# Patient Record
Sex: Male | Born: 1955 | Smoking: Former smoker
Health system: Southern US, Community
[De-identification: ages and names within clinical notes are randomized; demographics above are authoritative.]

## PROBLEM LIST (undated history)

## (undated) DIAGNOSIS — Z8616 Personal history of COVID-19: Secondary | ICD-10-CM

## (undated) DIAGNOSIS — I639 Cerebral infarction, unspecified: Secondary | ICD-10-CM

## (undated) DIAGNOSIS — K5090 Crohn's disease, unspecified, without complications: Secondary | ICD-10-CM

## (undated) DIAGNOSIS — E119 Type 2 diabetes mellitus without complications: Secondary | ICD-10-CM

## (undated) HISTORY — PX: ANTERIOR CRUCIATE LIGAMENT REPAIR: SHX115

## (undated) HISTORY — PX: OTHER SURGICAL HISTORY: SHX169

## (undated) HISTORY — PX: ILEOSTOMY: SHX1783

---

## 2011-10-24 DIAGNOSIS — K5190 Ulcerative colitis, unspecified, without complications: Secondary | ICD-10-CM | POA: Insufficient documentation

## 2012-11-26 DIAGNOSIS — Z9889 Other specified postprocedural states: Secondary | ICD-10-CM | POA: Insufficient documentation

## 2013-01-03 DIAGNOSIS — R188 Other ascites: Secondary | ICD-10-CM | POA: Insufficient documentation

## 2013-03-23 DIAGNOSIS — Z87448 Personal history of other diseases of urinary system: Secondary | ICD-10-CM | POA: Insufficient documentation

## 2015-03-19 DIAGNOSIS — E119 Type 2 diabetes mellitus without complications: Secondary | ICD-10-CM | POA: Insufficient documentation

## 2016-01-31 DIAGNOSIS — I639 Cerebral infarction, unspecified: Secondary | ICD-10-CM | POA: Insufficient documentation

## 2016-01-31 DIAGNOSIS — S46211A Strain of muscle, fascia and tendon of other parts of biceps, right arm, initial encounter: Secondary | ICD-10-CM | POA: Insufficient documentation

## 2016-02-01 DIAGNOSIS — R109 Unspecified abdominal pain: Secondary | ICD-10-CM | POA: Insufficient documentation

## 2017-04-01 DIAGNOSIS — Z8719 Personal history of other diseases of the digestive system: Secondary | ICD-10-CM | POA: Insufficient documentation

## 2017-04-08 DIAGNOSIS — E1142 Type 2 diabetes mellitus with diabetic polyneuropathy: Secondary | ICD-10-CM | POA: Insufficient documentation

## 2017-04-08 DIAGNOSIS — M205X1 Other deformities of toe(s) (acquired), right foot: Secondary | ICD-10-CM | POA: Insufficient documentation

## 2018-04-16 DIAGNOSIS — G8929 Other chronic pain: Secondary | ICD-10-CM | POA: Insufficient documentation

## 2018-04-16 DIAGNOSIS — M75121 Complete rotator cuff tear or rupture of right shoulder, not specified as traumatic: Secondary | ICD-10-CM | POA: Insufficient documentation

## 2019-03-19 DIAGNOSIS — U071 COVID-19: Secondary | ICD-10-CM

## 2019-03-19 HISTORY — DX: COVID-19: U07.1

## 2019-09-15 ENCOUNTER — Encounter (HOSPITAL_BASED_OUTPATIENT_CLINIC_OR_DEPARTMENT_OTHER): Payer: Self-pay | Admitting: Orthopaedic Surgery

## 2019-09-15 ENCOUNTER — Other Ambulatory Visit: Payer: Self-pay

## 2019-09-15 NOTE — Progress Notes (Signed)
Left message with Jack Clark at Dr. Austin Miles office that we need orders for patient to hold Plavix and Asprin for surgery.

## 2019-09-20 NOTE — H&P (Signed)
PREOPERATIVE H&P  Chief Complaint: RIGHT SHOULDER PRIMARY OSTEOARTHRITIS, IMPINGEMENT SYNDROME, ROTATOR CUFF TEAR  HPI: Jack Clark is a 64 y.o. male who is scheduled for SHOULDER ARTHROSCOPY WITH EXTENSIVE DEBRIDEMENT, DISTAL CLAVICLE EXCISION, SUBACROMIAL DECOMPRESSION PARTIAL ACROMIOPLASTY SHOULDER ARTHROSCOPY WITH ROTATOR CUFF REPAIR.   Patient has a past medical history significant for stroke in 2018, chron's disease, diabetes.   The patient was seen in consultation at the request of Dr. Sherlean Clark for right shoulder pain. He is  a 64 year old who is the husband of Jack Clark. He has had right shoulder pain for years. This has been worse for the last month. He has had surgery for Crohns and had an ileostomy and a reversal.  He had a stroke which affected his right side. He has an A1C of 7.4.   He is right hand dominant at baseline. He was trying to pressure wash and he had significant pain. He tore his distal biceps on the same side and  Dr. Amanda Clark fixed this previously.   His symptoms are rated as moderate to severe, and have been worsening.  This is significantly impairing activities of daily living.    Please see clinic note for further details on this patient's care.    He has elected for surgical management.   Past Medical History:  Diagnosis Date   Crohn's disease (HCC)    Diabetes mellitus without complication (HCC)    Stroke (HCC)    2018 no deficits    Past Surgical History:  Procedure Laterality Date   ANTERIOR CRUCIATE LIGAMENT REPAIR     ILEOSTOMY     ileostomy reversal     Social History   Socioeconomic History   Marital status: Unknown    Spouse name: Not on file   Number of children: Not on file   Years of education: Not on file   Highest education level: Not on file  Occupational History   Not on file  Tobacco Use   Smoking status: Former Smoker   Smokeless tobacco: Never Used   Tobacco comment: quit 15 years ago  Substance and  Sexual Activity   Alcohol use: Not Currently   Drug use: Not Currently   Sexual activity: Not on file  Other Topics Concern   Not on file  Social History Narrative   Not on file   Social Determinants of Health   Financial Resource Strain:    Difficulty of Paying Living Expenses:   Food Insecurity:    Worried About Programme researcher, broadcasting/film/video in the Last Year:    Barista in the Last Year:   Transportation Needs:    Freight forwarder (Medical):    Lack of Transportation (Non-Medical):   Physical Activity:    Days of Exercise per Week:    Minutes of Exercise per Session:   Stress:    Feeling of Stress :   Social Connections:    Frequency of Communication with Friends and Family:    Frequency of Social Gatherings with Friends and Family:    Attends Religious Services:    Active Member of Clubs or Organizations:    Attends Banker Meetings:    Marital Status:    History reviewed. No pertinent family history. No Known Allergies Prior to Admission medications   Medication Sig Start Date End Date Taking? Authorizing Provider  aspirin 81 MG chewable tablet Chew by mouth daily.   Yes [provider]  atorvastatin (LIPITOR) 20 MG tablet Take  20 mg by mouth daily.   Yes [provider]  clopidogrel (PLAVIX) 75 MG tablet Take 75 mg by mouth daily.   Yes [provider]  empagliflozin (JARDIANCE) 25 MG TABS tablet Take 25 mg by mouth daily.   Yes [provider]  metFORMIN (GLUCOPHAGE) 500 MG tablet Take by mouth 2 (two) times daily with a meal.   Yes [provider]    ROS: All other systems have been reviewed and were otherwise negative with the exception of those mentioned in the HPI and as above.  Physical Exam: General: Alert, no acute distress Cardiovascular: No pedal edema Respiratory: No cyanosis, no use of accessory musculature GI: No organomegaly, abdomen is soft and non-tender Skin: No  lesions in the area of chief complaint Neurologic: Sensation intact distally Psychiatric: Patient is competent for consent with normal mood and affect Lymphatic: No axillary or cervical lymphadenopathy  MUSCULOSKELETAL:  Right shoulder: right shoulder reveals an active forward elevation to 120, passive to the same. External rotation to 30 and internal rotation to L1. Weak supraspinatus.   Imaging: MRI demonstrates a full thickness supraspinatus tear  X-rays today, three views, demonstrate no acute osseous abnormalities. AC arthrosis and Type II acromion.  Assessment: RIGHT SHOULDER PRIMARY OSTEOARTHRITIS, IMPINGEMENT SYNDROME, ROTATOR CUFF TEAR  Plan: Plan for Procedure(s): SHOULDER ARTHROSCOPY WITH EXTENSIVE DEBRIDEMENT, DISTAL CLAVICLE EXCISION, SUBACROMIAL DECOMPRESSION PARTIAL ACROMIOPLASTY SHOULDER ARTHROSCOPY WITH ROTATOR CUFF REPAIR  The risks benefits and alternatives were discussed with the patient including but not limited to the risks of nonoperative treatment, versus surgical intervention including infection, bleeding, nerve injury,  blood clots, cardiopulmonary complications, morbidity, mortality, among others, and they were willing to proceed.   The patient acknowledged the explanation, agreed to proceed with the plan and consent was signed.   He received clearance from his PCP, Dr. Maryjean Clark - okay to stop plavix 5 days prior to surgery.   Operative Plan: Right shoulder scope with SAD, DCE, RCR, BT Discharge Medications: Tylenol, Celebrex (short term), oxycodone, zofran DVT Prophylaxis: Resume plavix Physical Therapy: Outpatient PT Special Discharge needs: Sling   Jack Honey, PA-C  09/20/2019 6:25 PM

## 2019-09-21 ENCOUNTER — Other Ambulatory Visit (HOSPITAL_COMMUNITY)
Admission: RE | Admit: 2019-09-21 | Discharge: 2019-09-21 | Disposition: A | Discharge status: Home / Self Care | Payer: PRIVATE HEALTH INSURANCE | Source: Ambulatory Visit | Attending: Orthopaedic Surgery | Admitting: Orthopaedic Surgery

## 2019-09-21 ENCOUNTER — Encounter (HOSPITAL_BASED_OUTPATIENT_CLINIC_OR_DEPARTMENT_OTHER)
Admission: RE | Admit: 2019-09-21 | Discharge: 2019-09-21 | Disposition: A | Discharge status: Home / Self Care | Payer: PRIVATE HEALTH INSURANCE | Source: Ambulatory Visit | Attending: Orthopaedic Surgery | Admitting: Orthopaedic Surgery

## 2019-09-21 DIAGNOSIS — Z20822 Contact with and (suspected) exposure to covid-19: Secondary | ICD-10-CM | POA: Insufficient documentation

## 2019-09-21 DIAGNOSIS — Z01818 Encounter for other preprocedural examination: Secondary | ICD-10-CM | POA: Diagnosis not present

## 2019-09-21 LAB — BASIC METABOLIC PANEL
Anion gap: 10 (ref 5–15)
BUN: 9 mg/dL (ref 8–23)
CO2: 25 mmol/L (ref 22–32)
Calcium: 9.1 mg/dL (ref 8.9–10.3)
Chloride: 104 mmol/L (ref 98–111)
Creatinine, Ser: 0.92 mg/dL (ref 0.61–1.24)
GFR calc Af Amer: >60 mL/min (ref >60)
GFR calc non Af Amer: >60 mL/min (ref >60)
Glucose, Bld: 153 mg/dL — ABNORMAL HIGH (ref 70–99)
Potassium: 4.5 mmol/L (ref 3.5–5.1)
Sodium: 139 mmol/L (ref 135–145)

## 2019-09-21 LAB — SARS CORONAVIRUS 2 (TAT 6-24 HRS): SARS Coronavirus 2: NEGATIVE

## 2019-09-21 NOTE — Progress Notes (Signed)

## 2019-09-23 ENCOUNTER — Other Ambulatory Visit: Payer: Self-pay

## 2019-09-23 ENCOUNTER — Ambulatory Visit (HOSPITAL_BASED_OUTPATIENT_CLINIC_OR_DEPARTMENT_OTHER): Payer: PRIVATE HEALTH INSURANCE | Admitting: Anesthesiology

## 2019-09-23 ENCOUNTER — Ambulatory Visit (HOSPITAL_BASED_OUTPATIENT_CLINIC_OR_DEPARTMENT_OTHER)
Admission: RE | Admit: 2019-09-23 | Discharge: 2019-09-23 | Disposition: A | Discharge status: Home / Self Care | Payer: PRIVATE HEALTH INSURANCE | Attending: Orthopaedic Surgery | Admitting: Orthopaedic Surgery

## 2019-09-23 ENCOUNTER — Encounter (HOSPITAL_BASED_OUTPATIENT_CLINIC_OR_DEPARTMENT_OTHER): Payer: Self-pay | Admitting: Orthopaedic Surgery

## 2019-09-23 ENCOUNTER — Encounter (HOSPITAL_BASED_OUTPATIENT_CLINIC_OR_DEPARTMENT_OTHER)
Admission: RE | Disposition: A | Discharge status: Home / Self Care | Payer: Self-pay | Source: Home / Self Care | Attending: Orthopaedic Surgery

## 2019-09-23 DIAGNOSIS — S46111A Strain of muscle, fascia and tendon of long head of biceps, right arm, initial encounter: Secondary | ICD-10-CM | POA: Diagnosis not present

## 2019-09-23 DIAGNOSIS — M7541 Impingement syndrome of right shoulder: Secondary | ICD-10-CM | POA: Diagnosis not present

## 2019-09-23 DIAGNOSIS — M75121 Complete rotator cuff tear or rupture of right shoulder, not specified as traumatic: Secondary | ICD-10-CM | POA: Insufficient documentation

## 2019-09-23 DIAGNOSIS — X58XXXA Exposure to other specified factors, initial encounter: Secondary | ICD-10-CM | POA: Diagnosis not present

## 2019-09-23 DIAGNOSIS — Z7984 Long term (current) use of oral hypoglycemic drugs: Secondary | ICD-10-CM | POA: Insufficient documentation

## 2019-09-23 DIAGNOSIS — Z79899 Other long term (current) drug therapy: Secondary | ICD-10-CM | POA: Diagnosis not present

## 2019-09-23 DIAGNOSIS — K5090 Crohn's disease, unspecified, without complications: Secondary | ICD-10-CM | POA: Diagnosis not present

## 2019-09-23 DIAGNOSIS — Z8673 Personal history of transient ischemic attack (TIA), and cerebral infarction without residual deficits: Secondary | ICD-10-CM | POA: Diagnosis not present

## 2019-09-23 DIAGNOSIS — Z7902 Long term (current) use of antithrombotics/antiplatelets: Secondary | ICD-10-CM | POA: Insufficient documentation

## 2019-09-23 DIAGNOSIS — M19011 Primary osteoarthritis, right shoulder: Secondary | ICD-10-CM | POA: Insufficient documentation

## 2019-09-23 DIAGNOSIS — E119 Type 2 diabetes mellitus without complications: Secondary | ICD-10-CM | POA: Diagnosis not present

## 2019-09-23 DIAGNOSIS — Z7982 Long term (current) use of aspirin: Secondary | ICD-10-CM | POA: Insufficient documentation

## 2019-09-23 DIAGNOSIS — Z87891 Personal history of nicotine dependence: Secondary | ICD-10-CM | POA: Insufficient documentation

## 2019-09-23 HISTORY — DX: Cerebral infarction, unspecified: I63.9

## 2019-09-23 HISTORY — PX: SHOULDER ARTHROSCOPY WITH ROTATOR CUFF REPAIR: SHX5685

## 2019-09-23 HISTORY — DX: Type 2 diabetes mellitus without complications: E11.9

## 2019-09-23 HISTORY — DX: Crohn's disease, unspecified, without complications: K50.90

## 2019-09-23 HISTORY — PX: BICEPT TENODESIS: SHX5116

## 2019-09-23 LAB — GLUCOSE, CAPILLARY
Glucose-Capillary: 178 mg/dL — ABNORMAL HIGH (ref 70–99)
Glucose-Capillary: 149 mg/dL — ABNORMAL HIGH (ref 70–99)

## 2019-09-23 SURGERY — SHOULDER ARTHROSCOPY WITH SUBACROMIAL DECOMPRESSION AND DISTAL CLAVICLE EXCISION
Anesthesia: General | Site: Shoulder | Laterality: Right

## 2019-09-23 MED ORDER — ACETAMINOPHEN 160 MG/5ML PO SOLN: 325.0000 mg | ORAL | Status: DC | PRN

## 2019-09-23 MED ORDER — BUPIVACAINE LIPOSOME 1.3 % IJ SUSP
INTRAMUSCULAR | Status: DC | PRN
Start: 2019-09-23 — End: 2019-09-23
  Administered 2019-09-23 (×5): 2 mL via PERINEURAL

## 2019-09-23 MED ORDER — SODIUM CHLORIDE 0.9 % IR SOLN
Status: DC | PRN
  Administered 2019-09-23: 12000 mL

## 2019-09-23 MED ORDER — FENTANYL CITRATE (PF) 100 MCG/2ML IJ SOLN
INTRAMUSCULAR | Status: AC
  Filled 2019-09-23: qty 2

## 2019-09-23 MED ORDER — LACTATED RINGERS IV SOLN: INTRAVENOUS | Status: DC

## 2019-09-23 MED ORDER — MIDAZOLAM HCL 2 MG/2ML IJ SOLN
INTRAMUSCULAR | Status: AC
  Filled 2019-09-23: qty 2

## 2019-09-23 MED ORDER — EPINEPHRINE PF 1 MG/ML IJ SOLN
INTRAMUSCULAR | Status: AC
  Filled 2019-09-23: qty 1

## 2019-09-23 MED ORDER — DEXAMETHASONE SODIUM PHOSPHATE 4 MG/ML IJ SOLN
INTRAMUSCULAR | Status: DC | PRN
  Administered 2019-09-23: 5 mg via INTRAVENOUS

## 2019-09-23 MED ORDER — CEFAZOLIN SODIUM-DEXTROSE 2-4 GM/100ML-% IV SOLN
2.0000 g | INTRAVENOUS | Status: AC
  Administered 2019-09-23: 2 g via INTRAVENOUS

## 2019-09-23 MED ORDER — ONDANSETRON HCL 4 MG/2ML IJ SOLN
INTRAMUSCULAR | Status: DC | PRN
  Administered 2019-09-23: 4 mg via INTRAVENOUS

## 2019-09-23 MED ORDER — LIDOCAINE 2% (20 MG/ML) 5 ML SYRINGE
INTRAMUSCULAR | Status: AC
  Filled 2019-09-23: qty 5

## 2019-09-23 MED ORDER — PHENYLEPHRINE 40 MCG/ML (10ML) SYRINGE FOR IV PUSH (FOR BLOOD PRESSURE SUPPORT)
PREFILLED_SYRINGE | INTRAVENOUS | Status: AC
  Filled 2019-09-23: qty 10

## 2019-09-23 MED ORDER — DEXAMETHASONE SODIUM PHOSPHATE 10 MG/ML IJ SOLN
INTRAMUSCULAR | Status: AC
  Filled 2019-09-23: qty 1

## 2019-09-23 MED ORDER — SUCCINYLCHOLINE CHLORIDE 200 MG/10ML IV SOSY
PREFILLED_SYRINGE | INTRAVENOUS | Status: AC
  Filled 2019-09-23: qty 10

## 2019-09-23 MED ORDER — ONDANSETRON HCL 4 MG/2ML IJ SOLN: 4.0000 mg | Freq: Once | INTRAMUSCULAR | Status: DC | PRN

## 2019-09-23 MED ORDER — SODIUM CHLORIDE 0.9 % IR SOLN
Status: DC | PRN
  Administered 2019-09-23: 5000 mL

## 2019-09-23 MED ORDER — MIDAZOLAM HCL 2 MG/2ML IJ SOLN
2.0000 mg | Freq: Once | INTRAMUSCULAR | Status: AC
  Administered 2019-09-23: 2 mg via INTRAVENOUS

## 2019-09-23 MED ORDER — ONDANSETRON HCL 4 MG/2ML IJ SOLN
INTRAMUSCULAR | Status: AC
  Filled 2019-09-23: qty 2

## 2019-09-23 MED ORDER — ONDANSETRON HCL 4 MG PO TABS: 4.0000 mg | ORAL_TABLET | Freq: Three times a day (TID) | ORAL | 1 refills | Status: AC | PRN

## 2019-09-23 MED ORDER — ACETAMINOPHEN 500 MG PO TABS
1000.0000 mg | ORAL_TABLET | Freq: Three times a day (TID) | ORAL | 0 refills | Status: AC
Start: 2019-09-23 — End: 2019-10-07

## 2019-09-23 MED ORDER — MEPERIDINE HCL 25 MG/ML IJ SOLN: 6.2500 mg | INTRAMUSCULAR | Status: DC | PRN

## 2019-09-23 MED ORDER — PROPOFOL 10 MG/ML IV BOLUS
INTRAVENOUS | Status: DC | PRN
  Administered 2019-09-23: 200 mg via INTRAVENOUS

## 2019-09-23 MED ORDER — CELECOXIB 100 MG PO CAPS
100.0000 mg | ORAL_CAPSULE | Freq: Two times a day (BID) | ORAL | 0 refills | Status: AC
Start: 2019-09-23 — End: 2019-10-07

## 2019-09-23 MED ORDER — OXYCODONE HCL 5 MG PO TABS: 5.0000 mg | ORAL_TABLET | Freq: Once | ORAL | Status: DC | PRN

## 2019-09-23 MED ORDER — OXYCODONE HCL 5 MG/5ML PO SOLN: 5.0000 mg | Freq: Once | ORAL | Status: DC | PRN

## 2019-09-23 MED ORDER — OXYCODONE HCL 5 MG PO TABS: ORAL_TABLET | ORAL | 0 refills | Status: AC

## 2019-09-23 MED ORDER — CEFAZOLIN SODIUM-DEXTROSE 2-4 GM/100ML-% IV SOLN
INTRAVENOUS | Status: AC
  Filled 2019-09-23: qty 100

## 2019-09-23 MED ORDER — KETOROLAC TROMETHAMINE 30 MG/ML IJ SOLN: 30.0000 mg | Freq: Once | INTRAMUSCULAR | Status: DC | PRN

## 2019-09-23 MED ORDER — BUPIVACAINE-EPINEPHRINE (PF) 0.5% -1:200000 IJ SOLN
INTRAMUSCULAR | Status: DC | PRN
  Administered 2019-09-23 (×5): 3 mL via PERINEURAL

## 2019-09-23 MED ORDER — FENTANYL CITRATE (PF) 100 MCG/2ML IJ SOLN: 25.0000 ug | INTRAMUSCULAR | Status: DC | PRN

## 2019-09-23 MED ORDER — ACETAMINOPHEN 325 MG PO TABS: 325.0000 mg | ORAL_TABLET | ORAL | Status: DC | PRN

## 2019-09-23 MED ORDER — SUCCINYLCHOLINE CHLORIDE 20 MG/ML IJ SOLN
INTRAMUSCULAR | Status: DC | PRN
  Administered 2019-09-23: 140 mg via INTRAVENOUS

## 2019-09-23 MED ORDER — LIDOCAINE 2% (20 MG/ML) 5 ML SYRINGE
INTRAMUSCULAR | Status: DC | PRN
  Administered 2019-09-23: 100 mg via INTRAVENOUS

## 2019-09-23 MED ORDER — FENTANYL CITRATE (PF) 100 MCG/2ML IJ SOLN
100.0000 ug | Freq: Once | INTRAMUSCULAR | Status: AC
  Administered 2019-09-23: 100 ug via INTRAVENOUS

## 2019-09-23 SURGICAL SUPPLY — 72 items
ANCHOR SUT 1.8 FBRTK KNTLS 2SU (Anchor) ×6 IMPLANT
ANCHOR SUT BIO SW 4.75X19.1 (Anchor) ×3 IMPLANT
BLADE EXCALIBUR 4.0MM X 13CM (MISCELLANEOUS) ×1
BLADE EXCALIBUR 4.0X13 (MISCELLANEOUS) ×2 IMPLANT
BURR OVAL 8 FLU 4.0MM X 13CM (MISCELLANEOUS)
BURR OVAL 8 FLU 4.0X13 (MISCELLANEOUS) IMPLANT
CANNULA 5.75X71 LONG (CANNULA) IMPLANT
CANNULA PASSPORT 5 (CANNULA) IMPLANT
CANNULA PASSPORT 5CM (CANNULA)
CANNULA PASSPORT BUTTON 10-40 (CANNULA) ×3 IMPLANT
CANNULA TWIST IN 8.25X7CM (CANNULA) IMPLANT
CHLORAPREP W/TINT 26 (MISCELLANEOUS) ×3 IMPLANT
CLOSURE STERI-STRIP 1/2X4 (GAUZE/BANDAGES/DRESSINGS)
CLSR STERI-STRIP ANTIMIC 1/2X4 (GAUZE/BANDAGES/DRESSINGS) IMPLANT
COVER WAND RF STERILE (DRAPES) IMPLANT
DECANTER SPIKE VIAL GLASS SM (MISCELLANEOUS) IMPLANT
DISSECTOR 3.5MM X 13CM CVD (MISCELLANEOUS) IMPLANT
DISSECTOR 4.0MMX13CM CVD (MISCELLANEOUS) IMPLANT
DRAPE IMP U-DRAPE 54X76 (DRAPES) ×3 IMPLANT
DRAPE INCISE IOBAN 66X45 STRL (DRAPES) IMPLANT
DRAPE SHOULDER BEACH CHAIR (DRAPES) ×3 IMPLANT
DRSG PAD ABDOMINAL 8X10 ST (GAUZE/BANDAGES/DRESSINGS) ×3 IMPLANT
DW OUTFLOW CASSETTE/TUBE SET (MISCELLANEOUS) ×3 IMPLANT
GAUZE SPONGE 4X4 12PLY STRL (GAUZE/BANDAGES/DRESSINGS) ×3 IMPLANT
GAUZE XEROFORM 1X8 LF (GAUZE/BANDAGES/DRESSINGS) IMPLANT
GLOVE BIO SURGEON STRL SZ 6.5 (GLOVE) ×2 IMPLANT
GLOVE BIO SURGEONS STRL SZ 6.5 (GLOVE) ×1
GLOVE BIOGEL PI IND STRL 6.5 (GLOVE) ×1 IMPLANT
GLOVE BIOGEL PI IND STRL 7.0 (GLOVE) ×2 IMPLANT
GLOVE BIOGEL PI IND STRL 8 (GLOVE) ×1 IMPLANT
GLOVE BIOGEL PI INDICATOR 6.5 (GLOVE) ×2
GLOVE BIOGEL PI INDICATOR 7.0 (GLOVE) ×4
GLOVE BIOGEL PI INDICATOR 8 (GLOVE) ×2
GLOVE ECLIPSE 6.5 STRL STRAW (GLOVE) ×3 IMPLANT
GLOVE ECLIPSE 8.0 STRL XLNG CF (GLOVE) ×3 IMPLANT
GOWN STRL REUS W/ TWL LRG LVL3 (GOWN DISPOSABLE) ×2 IMPLANT
GOWN STRL REUS W/TWL LRG LVL3 (GOWN DISPOSABLE) ×4
GOWN STRL REUS W/TWL XL LVL3 (GOWN DISPOSABLE) ×3 IMPLANT
IMPL SPEEDBRIDGE KIT (Orthopedic Implant) ×1 IMPLANT
IMPLANT SPEEDBRIDGE KIT (Orthopedic Implant) ×3 IMPLANT
KIT STABILIZATION SHOULDER (MISCELLANEOUS) ×3 IMPLANT
KIT STR SPEAR 1.8 FBRTK DISP (KITS) IMPLANT
LASSO 90 CVE QUICKPAS (DISPOSABLE) IMPLANT
LASSO CRESCENT QUICKPASS (SUTURE) IMPLANT
MANIFOLD NEPTUNE II (INSTRUMENTS) ×3 IMPLANT
NDL SAFETY ECLIPSE 18X1.5 (NEEDLE) ×1 IMPLANT
NEEDLE HYPO 18GX1.5 SHARP (NEEDLE) ×2
NEEDLE SCORPION MULTI FIRE (NEEDLE) IMPLANT
PACK BASIN DAY SURGERY FS (CUSTOM PROCEDURE TRAY) ×3 IMPLANT
PACK DSU ARTHROSCOPY (CUSTOM PROCEDURE TRAY) ×3 IMPLANT
PAD ORTHO SHOULDER 7X19 LRG (SOFTGOODS) ×3 IMPLANT
PORT APPOLLO RF 90DEGREE MULTI (SURGICAL WAND) ×3 IMPLANT
RESTRAINT HEAD UNIVERSAL NS (MISCELLANEOUS) ×3 IMPLANT
SHEET MEDIUM DRAPE 40X70 STRL (DRAPES) ×3 IMPLANT
SLEEVE SCD COMPRESS KNEE MED (MISCELLANEOUS) ×3 IMPLANT
SLING ARM FOAM STRAP LRG (SOFTGOODS) IMPLANT
SPONGE LAP 4X18 RFD (DISPOSABLE) IMPLANT
SUT FIBERWIRE #2 38 T-5 BLUE (SUTURE)
SUT MNCRL AB 4-0 PS2 18 (SUTURE) ×3 IMPLANT
SUT PDS AB 0 CT 36 (SUTURE) ×3 IMPLANT
SUT PDS AB 1 CT  36 (SUTURE)
SUT PDS AB 1 CT 36 (SUTURE) IMPLANT
SUT TIGER TAPE 7 IN WHITE (SUTURE) IMPLANT
SUTURE FIBERWR #2 38 T-5 BLUE (SUTURE) IMPLANT
SUTURE TAPE TIGERLINK 1.3MM BL (SUTURE) IMPLANT
SUTURETAPE TIGERLINK 1.3MM BL (SUTURE)
SYR 5ML LL (SYRINGE) ×3 IMPLANT
TAPE FIBER 2MM 7IN #2 BLUE (SUTURE) IMPLANT
TOWEL GREEN STERILE FF (TOWEL DISPOSABLE) ×3 IMPLANT
TUBE CONNECTING 20'X1/4 (TUBING) ×1
TUBE CONNECTING 20X1/4 (TUBING) ×2 IMPLANT
TUBING ARTHROSCOPY IRRIG 16FT (MISCELLANEOUS) ×3 IMPLANT

## 2019-09-23 NOTE — Progress Notes (Signed)
Assisted Dr. Hatchett with right, ultrasound guided, interscalene  block. Side rails up, monitors on throughout procedure. See vital signs in flow sheet. Tolerated Procedure well.  

## 2019-09-23 NOTE — Progress Notes (Signed)
Pt complained of pain/cramping in both legs when got up to chair . Pt states he frequently gets same cramping feeling in calves at home. Pt ambulated with assistance in hall then back to chair.Pt states this is what he does at home to improve cramping..  Pt now states calves feeling better than before just sore. Pt instructed to notify [facility] if soreness does not go away or gets worse

## 2019-09-23 NOTE — Interval H&P Note (Signed)
History and Physical Interval Note:  09/23/2019 11:03 AM  Jack Clark  has presented today for surgery, with the diagnosis of RIGHT SHOULDER PRIMARY OSTEOARTHRITIS, IMPINGEMENT SYNDROME, ROTATOR CUFF TEAR.  The various methods of treatment have been discussed with the patient and family. After consideration of risks, benefits and other options for treatment, the patient has consented to  Procedure(s): SHOULDER ARTHROSCOPY WITH EXTENSIVE DEBRIDEMENT, DISTAL CLAVICLE EXCISION, SUBACROMIAL DECOMPRESSION PARTIAL ACROMIOPLASTY (Right) SHOULDER ARTHROSCOPY WITH ROTATOR CUFF REPAIR (Right) as a surgical intervention.  The patient's history has been reviewed, patient examined, no change in status, stable for surgery.  I have reviewed the patient's chart and labs.  Questions were answered to the patient's satisfaction.     Bjorn Pippin

## 2019-09-23 NOTE — Anesthesia Procedure Notes (Signed)
Procedure Name: Intubation Performed by: Tawni Millers, CRNA Pre-anesthesia Checklist: Patient identified, Emergency Drugs available, Suction available and Patient being monitored Patient Re-evaluated:Patient Re-evaluated prior to induction Oxygen Delivery Method: Circle system utilized Preoxygenation: Pre-oxygenation with 100% oxygen Induction Type: IV induction Ventilation: Mask ventilation without difficulty Laryngoscope Size: Mac and 3 Grade View: Grade III Tube type: Oral Tube size: 7.5 mm Number of attempts: 1 Airway Equipment and Method: Stylet and Oral airway Placement Confirmation: ETT inserted through vocal cords under direct vision,  positive ETCO2 and breath sounds checked- equal and bilateral Tube secured with: Tape Dental Injury: Teeth and Oropharynx as per pre-operative assessment

## 2019-09-23 NOTE — Discharge Instructions (Signed)
Post Anesthesia Home Care Instructions  Activity: Get plenty of rest for the remainder of the day. A responsible individual must stay with you for 24 hours following the procedure.  For the next 24 hours, DO NOT: -Drive a car -Advertising copywriter -Drink alcoholic beverages -Take any medication unless instructed by your physician -Make any legal decisions or sign important papers.  Meals: Start with liquid foods such as gelatin or soup. Progress to regular foods as tolerated. Avoid greasy, spicy, heavy foods. If nausea and/or vomiting occur, drink only clear liquids until the nausea and/or vomiting subsides. Call your physician if vomiting continues.  Special Instructions/Symptoms: Your throat may feel dry or sore from the anesthesia or the breathing tube placed in your throat during surgery. If this causes discomfort, gargle with warm salt water. The discomfort should disappear within 24 hours.  If you had a scopolamine patch placed behind your ear for the management of post- operative nausea and/or vomiting:  1. The medication in the patch is effective for 72 hours, after which it should be removed.  Wrap patch in a tissue and discard in the trash. Wash hands thoroughly with soap and water. 2. You may remove the patch earlier than 72 hours if you experience unpleasant side effects which may include dry mouth, dizziness or visual disturbances. 3. Avoid touching the patch. Wash your hands with soap and water after contact with the patch.      Regional Anesthesia Blocks  1. Numbness or the inability to move the "blocked" extremity may last from 3-48 hours after placement. The length of time depends on the medication injected and your individual response to the medication. If the numbness is not going away after 48 hours, call your surgeon.  2. The extremity that is blocked will need to be protected until the numbness is gone and the  Strength has returned. Because you cannot feel it, you  will need to take extra care to avoid injury. Because it may be weak, you may have difficulty moving it or using it. You may not know what position it is in without looking at it while the block is in effect.  3. For blocks in the legs and feet, returning to weight bearing and walking needs to be done carefully. You will need to wait until the numbness is entirely gone and the strength has returned. You should be able to move your leg and foot normally before you try and bear weight or walk. You will need someone to be with you when you first try to ensure you do not fall and possibly risk injury.  4. Bruising and tenderness at the needle site are common side effects and will resolve in a few days.  5. Persistent numbness or new problems with movement should be communicated to the surgeon or the [facility] [phone] [facility] [phone]).Regional Anesthesia Blocks  1. Numbness or the inability to move the "blocked" extremity may last from 3-48 hours after placement. The length of time depends on the medication injected and your individual response to the medication. If the numbness is not going away after 48 hours, call your surgeon.  2. The extremity that is blocked will need to be protected until the numbness is gone and the  Strength has returned. Because you cannot feel it, you will need to take extra care to avoid injury. Because it may be weak, you may have difficulty moving it or using it. You may not know what position  it is in without looking at it while the block is in effect.  3. For blocks in the legs and feet, returning to weight bearing and walking needs to be done carefully. You will need to wait until the numbness is entirely gone and the strength has returned. You should be able to move your leg and foot normally before you try and bear weight or walk. You will need someone to be with you when you first try to ensure you do not fall and possibly risk  injury.  4. Bruising and tenderness at the needle site are common side effects and will resolve in a few days.  5. Persistent numbness or new problems with movement should be communicated to the surgeon or the [facility] [phone] [facility] [phone]).   Information for Discharge Teaching: EXPAREL (bupivacaine liposome injectable suspension)   Your surgeon or anesthesiologist gave you EXPAREL(bupivacaine) to help control your pain after surgery.   EXPAREL is a local anesthetic that provides pain relief by numbing the tissue around the surgical site.  EXPAREL is designed to release pain medication over time and can control pain for up to 72 hours.  Depending on how you respond to EXPAREL, you may require less pain medication during your recovery.  Possible side effects:  Temporary loss of sensation or ability to move in the area where bupivacaine was injected.  Nausea, vomiting, constipation  Rarely, numbness and tingling in your mouth or lips, lightheadedness, or anxiety may occur.  Call your doctor right away if you think you may be experiencing any of these sensations, or if you have other questions regarding possible side effects.  Follow all other discharge instructions given to you by your surgeon or nurse. Eat a healthy diet and drink plenty of water or other fluids.  If you return to the hospital for any reason within 96 hours following the administration of EXPAREL, it is important for health care providers to know that you have received this anesthetic. A teal colored band has been placed on your arm with the date, time and amount of EXPAREL you have received in order to alert and inform your health care providers. Please leave this armband in place for the full 96 hours following administration, and then you may remove the band.

## 2019-09-23 NOTE — Anesthesia Postprocedure Evaluation (Signed)
Anesthesia Post Note  Patient: Clayvon Parlett  Procedure(s) Performed: SHOULDER ARTHROSCOPY WITH EXTENSIVE DEBRIDEMENT, DISTAL CLAVICLE EXCISION, SUBACROMIAL DECOMPRESSION PARTIAL ACROMIOPLASTY (Right Shoulder) SHOULDER ARTHROSCOPY WITH ROTATOR CUFF REPAIR (Right Shoulder) BICEPS TENODESIS (Right Shoulder)     Patient location during evaluation: PACU Anesthesia Type: General Level of consciousness: awake and sedated Pain management: pain level controlled Vital Signs Assessment: post-procedure vital signs reviewed and stable Respiratory status: spontaneous breathing Cardiovascular status: stable Postop Assessment: no apparent nausea or vomiting Anesthetic complications: no   No complications documented.  Last Vitals:  Vitals:   09/23/19 1400 09/23/19 1412  BP: 109/77   Pulse: 66 73  Resp: 12 19  Temp: 36.5 C   SpO2: 100% 98%    Last Pain:  Vitals:   09/23/19 1400  TempSrc:   PainSc: 0-No pain                 Caren Macadam

## 2019-09-23 NOTE — Anesthesia Preprocedure Evaluation (Addendum)
Anesthesia Evaluation    Airway Mallampati: I       Dental no notable dental hx. (+) Teeth Intact   Pulmonary former smoker,    Pulmonary exam normal breath sounds clear to auscultation       Cardiovascular negative cardio ROS Normal cardiovascular exam Rhythm:Regular Rate:Normal     Neuro/Psych CVA, No Residual Symptoms negative psych ROS   GI/Hepatic Neg liver ROS,   Endo/Other  diabetes, Well Controlled, Type 2, Oral Hypoglycemic Agents  Renal/GU negative Renal ROS  negative genitourinary   Musculoskeletal   Abdominal Normal abdominal exam  (+)   Peds  Hematology negative hematology ROS (+)   Anesthesia Other Findings   Reproductive/Obstetrics                             Anesthesia Physical Anesthesia Plan  ASA: II  Anesthesia Plan: General   Post-op Pain Management:  Regional for Post-op pain   Induction: Intravenous  PONV Risk Score and Plan: 2 and Ondansetron, Dexamethasone and Midazolam  Airway Management Planned: Oral ETT  Additional Equipment: None  Intra-op Plan:   Post-operative Plan: Extubation in OR  Informed Consent: I have reviewed the patients History and Physical, chart, labs and discussed the procedure including the risks, benefits and alternatives for the proposed anesthesia with the patient or authorized representative who has indicated his/her understanding and acceptance.     Dental advisory given  Plan Discussed with: CRNA  Anesthesia Plan Comments:         Anesthesia Quick Evaluation

## 2019-09-23 NOTE — Transfer of Care (Signed)
Immediate Anesthesia Transfer of Care Note  Patient: Jack Clark  Procedure(s) Performed: SHOULDER ARTHROSCOPY WITH EXTENSIVE DEBRIDEMENT, DISTAL CLAVICLE EXCISION, SUBACROMIAL DECOMPRESSION PARTIAL ACROMIOPLASTY (Right Shoulder) SHOULDER ARTHROSCOPY WITH ROTATOR CUFF REPAIR (Right Shoulder) BICEPS TENODESIS (Right Shoulder)  Patient Location: PACU  Anesthesia Type:GA combined with regional for post-op pain  Level of Consciousness: awake  Airway & Oxygen Therapy: Patient Spontanous Breathing and Patient connected to face mask oxygen  Post-op Assessment: Report given to RN and Post -op Vital signs reviewed and stable  Post vital signs: Reviewed and stable  Last Vitals:  Vitals Value Taken Time  BP    Temp    Pulse 69 09/23/19 1347  Resp 15 09/23/19 1347  SpO2 99 % 09/23/19 1347  Vitals shown include unvalidated device data.  Last Pain:  Vitals:   09/23/19 0957  TempSrc: Oral  PainSc: 0-No pain         Complications: No complications documented.

## 2019-09-23 NOTE — Anesthesia Procedure Notes (Signed)
Anesthesia Regional Block: Interscalene brachial plexus block   Pre-Anesthetic Checklist: ,, timeout performed, Correct Patient, Correct Site, Correct Laterality, Correct Procedure, Correct Position, site marked, Risks and benefits discussed,  Surgical consent,  Pre-op evaluation,  At surgeon's request and post-op pain management  Laterality: Right and Upper  Prep: chloraprep       Needles:  Injection technique: Single-shot  Needle Type: Echogenic Stimulator Needle     Needle Length: 9cm  Needle Gauge: 20   Needle insertion depth: 1 cm   Additional Needles:   Procedures:,,,, ultrasound used (permanent image in chart),,,,  Narrative:  Start time: 09/23/2019 10:35 AM End time: 09/23/2019 10:45 AM Injection made incrementally with aspirations every 5 mL.  Performed by: Personally  Anesthesiologist: Leilani Able, MD

## 2019-09-24 ENCOUNTER — Encounter (HOSPITAL_BASED_OUTPATIENT_CLINIC_OR_DEPARTMENT_OTHER): Payer: Self-pay | Admitting: Orthopaedic Surgery

## 2019-09-29 NOTE — Op Note (Signed)
Orthopaedic Surgery Operative Note (CSN: [id number])  Jack Clark  1955-06-25 Date of Surgery: 09/23/2019   Diagnoses:  Right full-thickness rotator cuff tear, AC arthrosis and impingement biceps tearing  Procedure: Arthroscopic extensive debridement Arthroscopic subacromial decompression Arthroscopic rotator cuff repair Arthroscopic biceps tenodesis Arthroscopic distal clavicle excision   Operative Finding Exam under anesthesia: Full motion no limitation Articular space: No loose bodies, capsule intact, significant anterior labral fraying and superior labral fraying Chondral surfaces:Intact, no sign of chondral degeneration on the glenoid or humeral head Biceps: Partial tearing of the tendon itself with the anchor being somewhat loose Subscapularis: Normal Superior Cuff: Full-thickness supraspinatus tear Bursal side: Full-thickness supraspinatus tear  Successful completion of the planned procedure.  Patient had some thickened tissue superiorly and had trouble passing his stitches initially but we used combination of the penetrator and a scorpion to complete the repair.  Good repair at the end of the case.   Post-operative plan: The patient will be non-weightbearing in a sling with therapy to start in about 4 weeks.  The patient will be discharged home.  DVT prophylaxis not indicated in ambulatory upper extremity patient without known risk factors.   Pain control with PRN pain medication preferring oral medicines.  Follow up plan will be scheduled in approximately 7 days for incision check and XR.  Post-Op Diagnosis: Same Surgeons:Primary: Hiram Gash, MD Assistants:Caroline McBane PA-C Location: [place] OR ROOM 6 Anesthesia: General with Exparel interscalene block Antibiotics: Ancef 2 g with local vancomycin powder 1 g at the surgical site Tourniquet time: None Estimated Blood Loss: Minimal Complications: None Specimens: None Implants: Implant Name Type Inv. Item Serial No.  Manufacturer Lot No. LRB No. Used Action  ANCHOR SUT 1.8 FIBERTAK 2 SUT - [id number] Anchor ANCHOR SUT 1.8 FIBERTAK 2 SUT  [employer] [id number] Right 1 Implanted  ANCHOR SUT 1.8 FIBERTAK 2 SUT - [id number] Anchor ANCHOR SUT 1.8 FIBERTAK 2 SUT  [place] [id number] Right 1 Implanted  IMPLANT SPEEDBRIDGE KIT - [id number] Orthopedic Implant IMPLANT SPEEDBRIDGE KIT  [employer] [id number] Right 1 Implanted  ANCHOR SUT BIO SW 4.75X19.1 - [id number] Anchor ANCHOR SUT BIO SW 4.75X19.1  Rolinda Roan [id number] Right 1 Implanted    Indications for Surgery:   Jack Clark is a 64 y.o. male with continued shoulder pain refractory to nonoperative measures for extended period of time.  Patient an MRI years ago that demonstrated full-thickness versus tear and he treated nonoperatively but eventually had continued pain.  The risks and benefits were explained at length including but not limited to continued pain, cuff failure, biceps tenodesis failure, stiffness, need for further surgery and infection.   Procedure:   Patient was correctly identified in the preoperative holding area and operative site marked.  Patient brought to OR and positioned beachchair on an [place] table ensuring that all bony prominences were padded and the head was in an appropriate location.  Anesthesia was induced and the operative shoulder was prepped and draped in the usual sterile fashion.  Timeout was called preincision.  A standard posterior viewing portal was made after localizing the portal with a spinal needle.  An anterior accessory portal was also made.  After clearing the articular space the camera was positioned in the subacromial space.  Findings above.    Extensive debridement was performed of the anterior interval tissue, labral fraying and the bursa.  Subacromial decompression: We made a lateral portal with spinal needle guidance. We then proceeded to debride bursal tissue extensively with a shaver and arthrocare  device. At that  point we continued to identify the borders of the acromion and identify the spur. We then carefully preserved the deltoid fascia and used a burr to convert the acromion to a Type 1 flat acromion without issue.  Biceps tenodesis: We marked the tendon and then performed a tenotomy and debridement of the stump in the articular space. We then identified the biceps tendon in its groove suprapec with the arthroscope in the lateral portal taking care to move from lateral to medial to avoid injury to the subscapularis. At that point we unroofed the tendon itself and mobilized it. An accessory anterior portal was made in line with the tendon and we grasped it from the anterior superior portal and worked from the accessory anterior portal. Two Fibertak 1.51m knotless anchors were placed in the groove and the tendon was secured in a luggage loop style fashion with a pass of the limb of suture through the tendon using a scorpion device to avoid pull-through.  Repair was completed with good tension on the tendon.  Residual stump of the tendon was removed after being resected with a RF ablator.  Arthroscopic Rotator Cuff Repair: Tuberosity was prepared with a burr to a bleeding bed.  Following completion of the above we placed 2 4.7 Swivelock anchor loaded with a tape at inserted at the medial articular margin and an scorpion suture passing device, shuttled  sutures medially in a horizontal mattress suture configuration.  We then tied using arthroscopic knot tying techniques  each suture to its partner reducing the tendon at the prepared insertion site.  The fiber tape was not tied. With a medial row suture limbs then incorporated, 2 anteriorly and  2 posteriorly, into each of two 4.75 PEEK SwiveLock anchors, each placed 8 to 10 mm below the tip of the tuberosity and spanning anterior-posterior width of the tear with care to avoid over tensioning.   Distal Clavicle resection:  The scope was placed in the subacromial space  from the posterior portal.  A hemostat was placed through the anterior portal and we [facility].  A burr was then inserted and 10 mm of distal clavicle was resected taking care to avoid damage to the capsule around the joint and avoiding overhanging bone posteriorly.    The incisions were closed with absorbable monocryl and steri strips.  A sterile dressing was placed along with a sling. The patient was awoken from general anesthesia and taken to the PACU in stable condition without complication.   CNoemi Chapel PA-C, present and scrubbed throughout the case, critical for completion in a timely fashion, and for retraction, instrumentation, closure.

## 2019-12-16 DIAGNOSIS — J9601 Acute respiratory failure with hypoxia: Secondary | ICD-10-CM | POA: Diagnosis not present

## 2020-01-01 DIAGNOSIS — J1282 Pneumonia due to coronavirus disease 2019: Secondary | ICD-10-CM

## 2020-01-01 DIAGNOSIS — Z8673 Personal history of transient ischemic attack (TIA), and cerebral infarction without residual deficits: Secondary | ICD-10-CM

## 2020-01-01 DIAGNOSIS — J80 Acute respiratory distress syndrome: Secondary | ICD-10-CM | POA: Diagnosis not present

## 2020-01-01 DIAGNOSIS — U071 COVID-19: Secondary | ICD-10-CM

## 2020-01-01 DIAGNOSIS — J9621 Acute and chronic respiratory failure with hypoxia: Secondary | ICD-10-CM

## 2020-01-02 DIAGNOSIS — U071 COVID-19: Secondary | ICD-10-CM

## 2020-01-02 DIAGNOSIS — J80 Acute respiratory distress syndrome: Secondary | ICD-10-CM | POA: Diagnosis not present

## 2020-01-02 DIAGNOSIS — J9621 Acute and chronic respiratory failure with hypoxia: Secondary | ICD-10-CM | POA: Diagnosis not present

## 2020-01-02 DIAGNOSIS — J1282 Pneumonia due to coronavirus disease 2019: Secondary | ICD-10-CM

## 2020-01-02 DIAGNOSIS — Z8673 Personal history of transient ischemic attack (TIA), and cerebral infarction without residual deficits: Secondary | ICD-10-CM

## 2020-01-08 DIAGNOSIS — J1282 Pneumonia due to coronavirus disease 2019: Secondary | ICD-10-CM

## 2020-01-08 DIAGNOSIS — J9621 Acute and chronic respiratory failure with hypoxia: Secondary | ICD-10-CM

## 2020-01-08 DIAGNOSIS — Z8673 Personal history of transient ischemic attack (TIA), and cerebral infarction without residual deficits: Secondary | ICD-10-CM

## 2020-01-08 DIAGNOSIS — U071 COVID-19: Secondary | ICD-10-CM

## 2020-01-08 DIAGNOSIS — J80 Acute respiratory distress syndrome: Secondary | ICD-10-CM | POA: Diagnosis not present

## 2020-01-09 DIAGNOSIS — J9621 Acute and chronic respiratory failure with hypoxia: Secondary | ICD-10-CM

## 2020-01-09 DIAGNOSIS — Z8673 Personal history of transient ischemic attack (TIA), and cerebral infarction without residual deficits: Secondary | ICD-10-CM | POA: Diagnosis not present

## 2020-01-09 DIAGNOSIS — U071 COVID-19: Secondary | ICD-10-CM

## 2020-01-09 DIAGNOSIS — J1282 Pneumonia due to coronavirus disease 2019: Secondary | ICD-10-CM

## 2020-01-09 DIAGNOSIS — J80 Acute respiratory distress syndrome: Secondary | ICD-10-CM

## 2020-01-10 DIAGNOSIS — J80 Acute respiratory distress syndrome: Secondary | ICD-10-CM

## 2020-01-10 DIAGNOSIS — U071 COVID-19: Secondary | ICD-10-CM | POA: Diagnosis not present

## 2020-01-10 DIAGNOSIS — J9621 Acute and chronic respiratory failure with hypoxia: Secondary | ICD-10-CM

## 2020-01-10 DIAGNOSIS — J1282 Pneumonia due to coronavirus disease 2019: Secondary | ICD-10-CM

## 2020-01-10 DIAGNOSIS — Z8673 Personal history of transient ischemic attack (TIA), and cerebral infarction without residual deficits: Secondary | ICD-10-CM | POA: Diagnosis not present

## 2020-04-26 ENCOUNTER — Encounter (HOSPITAL_BASED_OUTPATIENT_CLINIC_OR_DEPARTMENT_OTHER): Payer: Self-pay | Admitting: Orthopaedic Surgery

## 2020-04-26 ENCOUNTER — Other Ambulatory Visit: Payer: Self-pay

## 2020-04-29 ENCOUNTER — Inpatient Hospital Stay (HOSPITAL_COMMUNITY): Admission: RE | Admit: 2020-04-29 | Payer: PRIVATE HEALTH INSURANCE | Source: Ambulatory Visit

## 2020-04-30 NOTE — H&P (Signed)
PREOPERATIVE H&P  Chief Complaint: RIGHT SHOULDER DEGENERATIVE JOINT DISEASE; RETEAR OF ROTATOR CUFF TEAR  HPI: Jack Clark is a 65 y.o. male who is scheduled for Procedure(s): TOTAL SHOULDER ARTHROPLASTY.   Patient has a past medical history significant for stroke in 2018, Crohn's disease, and diabetes.   Patient had a rotator cuff repair surgery on 09/23/2019. He did well after surgery. Unfortunately, he had another injury to his right shoulder when he was trying to pick up one of his grandchildren. He had immediate pain and inability to raise his arm.   His symptoms are rated as moderate to severe, and have been worsening.  This is significantly impairing activities of daily living.    Please see clinic note for further details on this patient's care.    He has elected for surgical management.   Past Medical History:  Diagnosis Date  . COVID 2021   hosptialized 28 days   . Crohn's disease (HCC)   . Diabetes mellitus without complication (HCC)   . Stroke [facility])    2018 no deficits    Past Surgical History:  Procedure Laterality Date  . ANTERIOR CRUCIATE LIGAMENT REPAIR    . BICEPT TENODESIS Right 09/23/2019   Procedure: BICEPS TENODESIS;  Surgeon: Bjorn Pippin, MD;  Location: [facility];  Service: Orthopedics;  Laterality: Right;  . ILEOSTOMY    . ileostomy reversal    . SHOULDER ARTHROSCOPY WITH ROTATOR CUFF REPAIR Right 09/23/2019   Procedure: SHOULDER ARTHROSCOPY WITH ROTATOR CUFF REPAIR;  Surgeon: Bjorn Pippin, MD;  Location: [facility];  Service: Orthopedics;  Laterality: Right;   Social History   Socioeconomic History  . Marital status: Unknown    Spouse name: Not on file  . Number of children: Not on file  . Years of education: Not on file  . Highest education level: Not on file  Occupational History  . Not on file  Tobacco Use  . Smoking status: Former Games developer  . Smokeless tobacco: Never Used  . Tobacco comment: quit 15 years  ago  Substance and Sexual Activity  . Alcohol use: Not Currently  . Drug use: Not Currently  . Sexual activity: Not on file  Other Topics Concern  . Not on file  Social History Narrative  . Not on file   Social Determinants of Health   Financial Resource Strain: Not on file  Food Insecurity: Not on file  Transportation Needs: Not on file  Physical Activity: Not on file  Stress: Not on file  Social Connections: Not on file   History reviewed. No pertinent family history. No Known Allergies Prior to Admission medications   Medication Sig Start Date End Date Taking? Authorizing Provider  aspirin 81 MG chewable tablet Chew by mouth daily.   Yes [provider]  atorvastatin (LIPITOR) 20 MG tablet Take 20 mg by mouth daily.   Yes [provider]  clopidogrel (PLAVIX) 75 MG tablet Take 75 mg by mouth daily.   Yes [provider]  empagliflozin (JARDIANCE) 25 MG TABS tablet Take 25 mg by mouth daily.   Yes [provider]  metFORMIN (GLUCOPHAGE) 500 MG tablet Take by mouth 2 (two) times daily with a meal.   Yes [provider]    ROS: All other systems have been reviewed and were otherwise negative with the exception of those mentioned in the HPI and as above.  Physical Exam: General: Alert, no acute distress Cardiovascular: No pedal edema Respiratory:  No cyanosis, no use of accessory musculature GI: No organomegaly, abdomen is soft and non-tender Skin: No lesions in the area of chief complaint Neurologic: Sensation intact distally Psychiatric: Patient is competent for consent with normal mood and affect Lymphatic: No axillary or cervical lymphadenopathy  MUSCULOSKELETAL:  Right shoulder: Weakness with supraspinatus testing and infraspinatus testing.  Range of motion is full, but limited secondary to pain.    Imaging: MR arthrogram of his right shoulder shows he has re-torn his cuff.  It is a partial re-tear and partial Type II tear.   Subtraction to the mid humeral head at best.  Assessment: RIGHT SHOULDER DEGENERATIVE JOINT DISEASE; RETEAR OF ROTATOR CUFF TEAR  Plan: Plan for Procedure(s): REVERSE TOTAL SHOULDER ARTHROPLASTY  The risks benefits and alternatives were discussed with the patient including but not limited to the risks of nonoperative treatment, versus surgical intervention including infection, bleeding, nerve injury,  blood clots, cardiopulmonary complications, morbidity, mortality, among others, and they were willing to proceed.   We additionally specifically discussed risks of axillary nerve injury, infection, periprosthetic fracture, continued pain and longevity of implants prior to beginning procedure.    Patient will be closely monitored in PACU for medical stabilization and pain control. If found stable in PACU, patient may be discharged home with outpatient follow-up. If any concerns regarding patient's stabilization patient will be admitted for observation after surgery. The patient is planning to be discharged home with outpatient PT.   The patient acknowledged the explanation, agreed to proceed with the plan and consent was signed.   Operative Plan: Right reverse total shoulder arthroplasty  Discharge Medications: Tylenol, Celebrex (short term), gabapentin, oxycodone, zofran DVT Prophylaxis: Resume plavix Physical Therapy: Outpatient PT Special Discharge needs: Sling   Vernetta Honey, PA-C  04/30/2020 3:50 PM

## 2020-05-01 ENCOUNTER — Other Ambulatory Visit (HOSPITAL_COMMUNITY)
Admission: RE | Admit: 2020-05-01 | Discharge: 2020-05-01 | Disposition: A | Discharge status: Home / Self Care | Payer: Medicare Other | Source: Ambulatory Visit | Attending: Orthopaedic Surgery | Admitting: Orthopaedic Surgery

## 2020-05-01 ENCOUNTER — Encounter (HOSPITAL_BASED_OUTPATIENT_CLINIC_OR_DEPARTMENT_OTHER)
Admission: RE | Admit: 2020-05-01 | Discharge: 2020-05-01 | Disposition: A | Discharge status: Home / Self Care | Payer: Medicare Other | Source: Ambulatory Visit | Attending: Orthopaedic Surgery | Admitting: Orthopaedic Surgery

## 2020-05-01 ENCOUNTER — Other Ambulatory Visit: Payer: Self-pay

## 2020-05-01 DIAGNOSIS — Z20822 Contact with and (suspected) exposure to covid-19: Secondary | ICD-10-CM | POA: Insufficient documentation

## 2020-05-01 DIAGNOSIS — Z01812 Encounter for preprocedural laboratory examination: Secondary | ICD-10-CM | POA: Diagnosis not present

## 2020-05-01 LAB — BASIC METABOLIC PANEL
Anion gap: 14 (ref 5–15)
BUN: 15 mg/dL (ref 8–23)
CO2: 20 mmol/L — ABNORMAL LOW (ref 22–32)
Calcium: 9.5 mg/dL (ref 8.9–10.3)
Chloride: 105 mmol/L (ref 98–111)
Creatinine, Ser: 1.04 mg/dL (ref 0.61–1.24)
GFR, Estimated: >60 mL/min (ref >60)
Glucose, Bld: 236 mg/dL — ABNORMAL HIGH (ref 70–99)
Potassium: 3.7 mmol/L (ref 3.5–5.1)
Sodium: 139 mmol/L (ref 135–145)

## 2020-05-01 LAB — SARS CORONAVIRUS 2 (TAT 6-24 HRS): SARS Coronavirus 2: NEGATIVE

## 2020-05-01 NOTE — Progress Notes (Incomplete)

## 2020-05-03 ENCOUNTER — Ambulatory Visit (HOSPITAL_COMMUNITY): Payer: Medicare Other

## 2020-05-03 ENCOUNTER — Ambulatory Visit (HOSPITAL_BASED_OUTPATIENT_CLINIC_OR_DEPARTMENT_OTHER): Payer: Medicare Other | Admitting: Certified Registered"

## 2020-05-03 ENCOUNTER — Encounter (HOSPITAL_BASED_OUTPATIENT_CLINIC_OR_DEPARTMENT_OTHER)
Admission: RE | Disposition: A | Discharge status: Home / Self Care | Payer: Self-pay | Source: Home / Self Care | Attending: Orthopaedic Surgery

## 2020-05-03 ENCOUNTER — Encounter (HOSPITAL_BASED_OUTPATIENT_CLINIC_OR_DEPARTMENT_OTHER): Payer: Self-pay | Admitting: Orthopaedic Surgery

## 2020-05-03 ENCOUNTER — Other Ambulatory Visit: Payer: Self-pay

## 2020-05-03 ENCOUNTER — Ambulatory Visit (HOSPITAL_BASED_OUTPATIENT_CLINIC_OR_DEPARTMENT_OTHER)
Admission: RE | Admit: 2020-05-03 | Discharge: 2020-05-03 | Disposition: A | Discharge status: Home / Self Care | Payer: Medicare Other | Attending: Orthopaedic Surgery | Admitting: Orthopaedic Surgery

## 2020-05-03 DIAGNOSIS — Z79899 Other long term (current) drug therapy: Secondary | ICD-10-CM | POA: Diagnosis not present

## 2020-05-03 DIAGNOSIS — K5090 Crohn's disease, unspecified, without complications: Secondary | ICD-10-CM | POA: Insufficient documentation

## 2020-05-03 DIAGNOSIS — M19011 Primary osteoarthritis, right shoulder: Secondary | ICD-10-CM | POA: Diagnosis not present

## 2020-05-03 DIAGNOSIS — Z7982 Long term (current) use of aspirin: Secondary | ICD-10-CM | POA: Insufficient documentation

## 2020-05-03 DIAGNOSIS — Z7984 Long term (current) use of oral hypoglycemic drugs: Secondary | ICD-10-CM | POA: Diagnosis not present

## 2020-05-03 DIAGNOSIS — Z96611 Presence of right artificial shoulder joint: Secondary | ICD-10-CM | POA: Insufficient documentation

## 2020-05-03 DIAGNOSIS — M65811 Other synovitis and tenosynovitis, right shoulder: Secondary | ICD-10-CM | POA: Insufficient documentation

## 2020-05-03 DIAGNOSIS — Z7902 Long term (current) use of antithrombotics/antiplatelets: Secondary | ICD-10-CM | POA: Diagnosis not present

## 2020-05-03 DIAGNOSIS — Z87891 Personal history of nicotine dependence: Secondary | ICD-10-CM | POA: Diagnosis not present

## 2020-05-03 DIAGNOSIS — E119 Type 2 diabetes mellitus without complications: Secondary | ICD-10-CM | POA: Diagnosis not present

## 2020-05-03 DIAGNOSIS — X509XXA Other and unspecified overexertion or strenuous movements or postures, initial encounter: Secondary | ICD-10-CM | POA: Insufficient documentation

## 2020-05-03 DIAGNOSIS — S46011A Strain of muscle(s) and tendon(s) of the rotator cuff of right shoulder, initial encounter: Secondary | ICD-10-CM | POA: Diagnosis present

## 2020-05-03 DIAGNOSIS — Z09 Encounter for follow-up examination after completed treatment for conditions other than malignant neoplasm: Secondary | ICD-10-CM

## 2020-05-03 DIAGNOSIS — Z8673 Personal history of transient ischemic attack (TIA), and cerebral infarction without residual deficits: Secondary | ICD-10-CM | POA: Insufficient documentation

## 2020-05-03 DIAGNOSIS — M25711 Osteophyte, right shoulder: Secondary | ICD-10-CM | POA: Insufficient documentation

## 2020-05-03 DIAGNOSIS — Z8616 Personal history of COVID-19: Secondary | ICD-10-CM | POA: Diagnosis not present

## 2020-05-03 HISTORY — DX: Personal history of COVID-19: Z86.16

## 2020-05-03 HISTORY — PX: REVERSE SHOULDER ARTHROPLASTY: SHX5054

## 2020-05-03 LAB — GLUCOSE, CAPILLARY
Glucose-Capillary: 166 mg/dL — ABNORMAL HIGH (ref 70–99)
Glucose-Capillary: 144 mg/dL — ABNORMAL HIGH (ref 70–99)

## 2020-05-03 SURGERY — ARTHROPLASTY, SHOULDER, TOTAL, REVERSE
Anesthesia: General | Site: Shoulder | Laterality: Right

## 2020-05-03 MED ORDER — SODIUM CHLORIDE 0.9 % IV SOLN
INTRAVENOUS | Status: AC | PRN
  Administered 2020-05-03: 1000 mL

## 2020-05-03 MED ORDER — CELECOXIB 200 MG PO CAPS
ORAL_CAPSULE | ORAL | Status: AC
  Filled 2020-05-03: qty 2

## 2020-05-03 MED ORDER — FENTANYL CITRATE (PF) 100 MCG/2ML IJ SOLN
INTRAMUSCULAR | Status: AC
  Filled 2020-05-03: qty 2

## 2020-05-03 MED ORDER — PHENYLEPHRINE 40 MCG/ML (10ML) SYRINGE FOR IV PUSH (FOR BLOOD PRESSURE SUPPORT)
PREFILLED_SYRINGE | INTRAVENOUS | Status: AC
  Filled 2020-05-03: qty 10

## 2020-05-03 MED ORDER — PHENYLEPHRINE HCL (PRESSORS) 10 MG/ML IV SOLN
INTRAVENOUS | Status: DC | PRN
  Administered 2020-05-03: 40 ug via INTRAVENOUS
  Administered 2020-05-03: 80 ug via INTRAVENOUS
  Administered 2020-05-03 (×4): 40 ug via INTRAVENOUS

## 2020-05-03 MED ORDER — FENTANYL CITRATE (PF) 100 MCG/2ML IJ SOLN
INTRAMUSCULAR | Status: DC | PRN
  Administered 2020-05-03: 50 ug via INTRAVENOUS

## 2020-05-03 MED ORDER — GABAPENTIN 300 MG PO CAPS
300.0000 mg | ORAL_CAPSULE | Freq: Once | ORAL | Status: AC
  Administered 2020-05-03: 300 mg via ORAL

## 2020-05-03 MED ORDER — CELECOXIB 200 MG PO CAPS
400.0000 mg | ORAL_CAPSULE | Freq: Once | ORAL | Status: AC
  Administered 2020-05-03: 400 mg via ORAL

## 2020-05-03 MED ORDER — CEFAZOLIN SODIUM-DEXTROSE 2-4 GM/100ML-% IV SOLN
2.0000 g | INTRAVENOUS | Status: AC
  Administered 2020-05-03: 2 g via INTRAVENOUS

## 2020-05-03 MED ORDER — PROPOFOL 10 MG/ML IV BOLUS
INTRAVENOUS | Status: AC
  Filled 2020-05-03: qty 40

## 2020-05-03 MED ORDER — PROPOFOL 10 MG/ML IV BOLUS
INTRAVENOUS | Status: DC | PRN
  Administered 2020-05-03: 160 mg via INTRAVENOUS
  Administered 2020-05-03: 20 mg via INTRAVENOUS

## 2020-05-03 MED ORDER — MIDAZOLAM HCL 2 MG/2ML IJ SOLN
INTRAMUSCULAR | Status: AC
  Filled 2020-05-03: qty 2

## 2020-05-03 MED ORDER — CELECOXIB 100 MG PO CAPS: 100.0000 mg | ORAL_CAPSULE | Freq: Two times a day (BID) | ORAL | 0 refills | Status: AC

## 2020-05-03 MED ORDER — ACETAMINOPHEN 500 MG PO TABS: 1000.0000 mg | ORAL_TABLET | Freq: Three times a day (TID) | ORAL | 0 refills | Status: AC

## 2020-05-03 MED ORDER — OXYCODONE HCL 5 MG PO TABS: 5.0000 mg | ORAL_TABLET | Freq: Once | ORAL | Status: DC | PRN

## 2020-05-03 MED ORDER — LIDOCAINE HCL (CARDIAC) PF 100 MG/5ML IV SOSY
PREFILLED_SYRINGE | INTRAVENOUS | Status: DC | PRN
  Administered 2020-05-03: 60 mg via INTRAVENOUS

## 2020-05-03 MED ORDER — MEPERIDINE HCL 25 MG/ML IJ SOLN: 6.2500 mg | INTRAMUSCULAR | Status: DC | PRN

## 2020-05-03 MED ORDER — SUGAMMADEX SODIUM 500 MG/5ML IV SOLN
INTRAVENOUS | Status: AC
  Filled 2020-05-03: qty 5

## 2020-05-03 MED ORDER — OXYCODONE HCL 5 MG PO TABS: ORAL_TABLET | ORAL | 0 refills | Status: AC

## 2020-05-03 MED ORDER — AMISULPRIDE (ANTIEMETIC) 5 MG/2ML IV SOLN: 10.0000 mg | Freq: Once | INTRAVENOUS | Status: DC | PRN

## 2020-05-03 MED ORDER — ARTIFICIAL TEARS OPHTHALMIC OINT
TOPICAL_OINTMENT | OPHTHALMIC | Status: AC
  Filled 2020-05-03: qty 3.5

## 2020-05-03 MED ORDER — MIDAZOLAM HCL 2 MG/2ML IJ SOLN
2.0000 mg | Freq: Once | INTRAMUSCULAR | Status: AC
  Administered 2020-05-03: 2 mg via INTRAVENOUS

## 2020-05-03 MED ORDER — LACTATED RINGERS IV BOLUS: 500.0000 mL | Freq: Once | INTRAVENOUS | Status: DC

## 2020-05-03 MED ORDER — CEFAZOLIN SODIUM-DEXTROSE 2-4 GM/100ML-% IV SOLN
INTRAVENOUS | Status: AC
  Filled 2020-05-03: qty 100

## 2020-05-03 MED ORDER — BUPIVACAINE LIPOSOME 1.3 % IJ SUSP
INTRAMUSCULAR | Status: DC | PRN
  Administered 2020-05-03: 10 mL via PERINEURAL

## 2020-05-03 MED ORDER — LIDOCAINE 2% (20 MG/ML) 5 ML SYRINGE
INTRAMUSCULAR | Status: AC
  Filled 2020-05-03: qty 5

## 2020-05-03 MED ORDER — LACTATED RINGERS IV SOLN: INTRAVENOUS | Status: DC

## 2020-05-03 MED ORDER — ROCURONIUM BROMIDE 10 MG/ML (PF) SYRINGE
PREFILLED_SYRINGE | INTRAVENOUS | Status: AC
  Filled 2020-05-03: qty 10

## 2020-05-03 MED ORDER — TRANEXAMIC ACID-NACL 1000-0.7 MG/100ML-% IV SOLN
1000.0000 mg | INTRAVENOUS | Status: AC
  Administered 2020-05-03: 1000 mg via INTRAVENOUS

## 2020-05-03 MED ORDER — FENTANYL CITRATE (PF) 100 MCG/2ML IJ SOLN
100.0000 ug | Freq: Once | INTRAMUSCULAR | Status: AC
Start: 2020-05-03 — End: 2020-05-03
  Administered 2020-05-03: 100 ug via INTRAVENOUS

## 2020-05-03 MED ORDER — DEXAMETHASONE SODIUM PHOSPHATE 10 MG/ML IJ SOLN
INTRAMUSCULAR | Status: AC
  Filled 2020-05-03: qty 2

## 2020-05-03 MED ORDER — PROMETHAZINE HCL 25 MG/ML IJ SOLN
6.2500 mg | INTRAMUSCULAR | Status: DC | PRN
Start: 2020-05-03 — End: 2020-05-03

## 2020-05-03 MED ORDER — GABAPENTIN 300 MG PO CAPS
ORAL_CAPSULE | ORAL | Status: AC
  Filled 2020-05-03: qty 1

## 2020-05-03 MED ORDER — ONDANSETRON HCL 4 MG/2ML IJ SOLN
INTRAMUSCULAR | Status: AC
  Filled 2020-05-03: qty 2

## 2020-05-03 MED ORDER — ACETAMINOPHEN 500 MG PO TABS
1000.0000 mg | ORAL_TABLET | Freq: Once | ORAL | Status: AC
  Administered 2020-05-03: 1000 mg via ORAL

## 2020-05-03 MED ORDER — ROCURONIUM BROMIDE 100 MG/10ML IV SOLN
INTRAVENOUS | Status: DC | PRN
  Administered 2020-05-03: 80 mg via INTRAVENOUS
  Administered 2020-05-03: 20 mg via INTRAVENOUS

## 2020-05-03 MED ORDER — SUGAMMADEX SODIUM 500 MG/5ML IV SOLN
INTRAVENOUS | Status: DC | PRN
  Administered 2020-05-03: 339.2 mg via INTRAVENOUS

## 2020-05-03 MED ORDER — TRANEXAMIC ACID-NACL 1000-0.7 MG/100ML-% IV SOLN
INTRAVENOUS | Status: AC
  Filled 2020-05-03: qty 100

## 2020-05-03 MED ORDER — GABAPENTIN 100 MG PO CAPS: 100.0000 mg | ORAL_CAPSULE | Freq: Three times a day (TID) | ORAL | 0 refills | Status: AC

## 2020-05-03 MED ORDER — DEXAMETHASONE SODIUM PHOSPHATE 4 MG/ML IJ SOLN
INTRAMUSCULAR | Status: DC | PRN
  Administered 2020-05-03: 4 mg via INTRAVENOUS

## 2020-05-03 MED ORDER — VANCOMYCIN HCL 1000 MG IV SOLR
INTRAVENOUS | Status: AC
  Filled 2020-05-03: qty 2000

## 2020-05-03 MED ORDER — ONDANSETRON HCL 4 MG/2ML IJ SOLN
INTRAMUSCULAR | Status: DC | PRN
  Administered 2020-05-03: 4 mg via INTRAVENOUS

## 2020-05-03 MED ORDER — ONDANSETRON HCL 4 MG PO TABS: 4.0000 mg | ORAL_TABLET | Freq: Three times a day (TID) | ORAL | 1 refills | Status: AC | PRN

## 2020-05-03 MED ORDER — OXYCODONE HCL 5 MG/5ML PO SOLN: 5.0000 mg | Freq: Once | ORAL | Status: DC | PRN

## 2020-05-03 MED ORDER — ACETAMINOPHEN 500 MG PO TABS
ORAL_TABLET | ORAL | Status: AC
  Filled 2020-05-03: qty 2

## 2020-05-03 MED ORDER — HYDROMORPHONE HCL 1 MG/ML IJ SOLN: 0.2500 mg | INTRAMUSCULAR | Status: DC | PRN

## 2020-05-03 SURGICAL SUPPLY — 70 items
BASEPLATE GLENOSPHERE 25 STD (Miscellaneous) ×2 IMPLANT
BIT DRILL 3.2 PERIPHERAL SCREW (BIT) ×2 IMPLANT
BLADE HEX COATED 2.75 (ELECTRODE) IMPLANT
BLADE SAW SAG 73X25 THK (BLADE) ×1
BLADE SAW SGTL 73X25 THK (BLADE) ×1 IMPLANT
BLADE SURG 10 STRL SS (BLADE) IMPLANT
BLADE SURG 15 STRL LF DISP TIS (BLADE) IMPLANT
BLADE SURG 15 STRL SS (BLADE)
BNDG COHESIVE 4X5 TAN STRL (GAUZE/BANDAGES/DRESSINGS) ×2 IMPLANT
CHLORAPREP W/TINT 26 (MISCELLANEOUS) ×2 IMPLANT
CLSR STERI-STRIP ANTIMIC 1/2X4 (GAUZE/BANDAGES/DRESSINGS) ×2 IMPLANT
COOLER ICEMAN CLASSIC (MISCELLANEOUS) ×2 IMPLANT
COVER BACK TABLE 60X90IN (DRAPES) ×2 IMPLANT
COVER MAYO STAND STRL (DRAPES) ×2 IMPLANT
COVER WAND RF STERILE (DRAPES) IMPLANT
DECANTER SPIKE VIAL GLASS SM (MISCELLANEOUS) IMPLANT
DRAPE IMP U-DRAPE 54X76 (DRAPES) IMPLANT
DRAPE INCISE IOBAN 66X45 STRL (DRAPES) ×2 IMPLANT
DRAPE U-SHAPE 76X120 STRL (DRAPES) ×4 IMPLANT
DRSG AQUACEL AG ADV 3.5X 6 (GAUZE/BANDAGES/DRESSINGS) ×2 IMPLANT
ELECT BLADE 4.0 EZ CLEAN MEGAD (MISCELLANEOUS) ×2
ELECT REM PT RETURN 9FT ADLT (ELECTROSURGICAL) ×2
ELECTRODE BLDE 4.0 EZ CLN MEGD (MISCELLANEOUS) ×1 IMPLANT
ELECTRODE REM PT RTRN 9FT ADLT (ELECTROSURGICAL) ×1 IMPLANT
GLENOSPHERE REV SHOULDER 36 (Joint) ×2 IMPLANT
GLOVE ECLIPSE 8.0 STRL XLNG CF (GLOVE) ×4 IMPLANT
GLOVE SRG 8 PF TXTR STRL LF DI (GLOVE) ×1 IMPLANT
GLOVE SURG ENC MOIS LTX SZ6.5 (GLOVE) ×2 IMPLANT
GLOVE SURG LTX SZ6.5 (GLOVE) ×4 IMPLANT
GLOVE SURG PR MICRO ENCORE 7 (GLOVE) ×2 IMPLANT
GLOVE SURG UNDER POLY LF SZ6.5 (GLOVE) ×2 IMPLANT
GLOVE SURG UNDER POLY LF SZ7 (GLOVE) ×4 IMPLANT
GLOVE SURG UNDER POLY LF SZ8 (GLOVE) ×1
GOWN STRL REUS W/ TWL LRG LVL3 (GOWN DISPOSABLE) ×2 IMPLANT
GOWN STRL REUS W/TWL LRG LVL3 (GOWN DISPOSABLE) ×2
GOWN STRL REUS W/TWL XL LVL3 (GOWN DISPOSABLE) ×2 IMPLANT
GUIDEWIRE GLENOID 2.5X220 (WIRE) ×2 IMPLANT
HANDPIECE INTERPULSE COAX TIP (DISPOSABLE) ×1
HUMERAL STEM AEQUALIS 3BX74MM (Stem) ×2 IMPLANT
IMPL REVERSE SHOULDER 0X3.5 (Shoulder) ×1 IMPLANT
IMPLANT REVERSE SHOULDER 0X3.5 (Shoulder) ×2 IMPLANT
INSERT REV KIT SHOULDER 9X36 (Joint) ×2 IMPLANT
KIT LEG STABILIZATION (KITS) ×2 IMPLANT
KIT STABILIZATION SHOULDER (MISCELLANEOUS) IMPLANT
MANIFOLD NEPTUNE II (INSTRUMENTS) ×2 IMPLANT
PACK BASIN DAY SURGERY FS (CUSTOM PROCEDURE TRAY) ×2 IMPLANT
PACK SHOULDER (CUSTOM PROCEDURE TRAY) ×2 IMPLANT
PAD COLD SHLDR WRAP-ON (PAD) ×2 IMPLANT
PENCIL SMOKE EVACUATOR (MISCELLANEOUS) IMPLANT
RESTRAINT HEAD UNIVERSAL NS (MISCELLANEOUS) ×2 IMPLANT
SCREW 5.0X18 (Screw) ×2 IMPLANT
SCREW 5.5X22 (Screw) ×2 IMPLANT
SCREW BONE THREAD 6.5X35 (Screw) ×2 IMPLANT
SCREW CENTRAL THREAD 6.5X45 (Screw) ×2 IMPLANT
SCREW PERIPHERAL 5.0X34 (Screw) ×2 IMPLANT
SET HNDPC FAN SPRY TIP SCT (DISPOSABLE) ×1 IMPLANT
SHEET MEDIUM DRAPE 40X70 STRL (DRAPES) ×2 IMPLANT
SLEEVE SCD COMPRESS KNEE MED (MISCELLANEOUS) ×2 IMPLANT
SPONGE LAP 18X18 RF (DISPOSABLE) IMPLANT
STEM HUMERAL AEQUALIS 3BX74MM (Stem) ×1 IMPLANT
SUT ETHIBOND 2 V 37 (SUTURE) ×2 IMPLANT
SUT ETHIBOND NAB CT1 #1 30IN (SUTURE) ×2 IMPLANT
SUT FIBERWIRE #5 38 CONV NDL (SUTURE) ×10
SUT MNCRL AB 4-0 PS2 18 (SUTURE) ×2 IMPLANT
SUT VIC AB 3-0 SH 27 (SUTURE) ×1
SUT VIC AB 3-0 SH 27X BRD (SUTURE) ×1 IMPLANT
SUTURE FIBERWR #5 38 CONV NDL (SUTURE) ×5 IMPLANT
SYR BULB IRRIG 60ML STRL (SYRINGE) IMPLANT
TOWEL GREEN STERILE FF (TOWEL DISPOSABLE) ×8 IMPLANT
TUBE SUCTION HIGH CAP CLEAR NV (SUCTIONS) ×2 IMPLANT

## 2020-05-03 NOTE — Transfer of Care (Signed)
Immediate Anesthesia Transfer of Care Note  Patient: Teodor Prater  Procedure(s) Performed: REVERSE SHOULDER ARTHROPLASTY (Right Shoulder)  Patient Location: PACU  Anesthesia Type:General and Regional  Level of Consciousness: drowsy  Airway & Oxygen Therapy: Patient Spontanous Breathing and Patient connected to face mask oxygen  Post-op Assessment: Report given to RN and Post -op Vital signs reviewed and stable  Post vital signs: Reviewed and stable  Last Vitals:  Vitals Value Taken Time  BP    Temp    Pulse 70 05/03/20 1012  Resp 15 05/03/20 1012  SpO2 100 % 05/03/20 1012  Vitals shown include unvalidated device data.  Last Pain:  Vitals:   05/03/20 0703  TempSrc: Oral  PainSc: 0-No pain      Patients Stated Pain Goal: 2 (05/03/20 0703)  Complications: No complications documented.

## 2020-05-03 NOTE — Anesthesia Procedure Notes (Signed)
Anesthesia Regional Block: Interscalene brachial plexus block   Pre-Anesthetic Checklist: ,, timeout performed, Correct Patient, Correct Site, Correct Laterality, Correct Procedure, Correct Position, site marked, Risks and benefits discussed,  Surgical consent,  Pre-op evaluation,  At surgeon's request and post-op pain management  Laterality: Right  Prep: chloraprep       Needles:  Injection technique: Single-shot  Needle Type: Stimiplex     Needle Length: 9cm  Needle Gauge: 21     Additional Needles:   Procedures:,,,, ultrasound used (permanent image in chart),,,,  Narrative:  Start time: 05/03/2020 8:03 AM End time: 05/03/2020 8:08 AM Injection made incrementally with aspirations every 5 mL.  Performed by: Personally  Anesthesiologist: Lowella Curb, MD

## 2020-05-03 NOTE — Addendum Note (Signed)
Addendum  created 05/03/20 1214 by Alford Highland, CRNA   Flowsheet accepted

## 2020-05-03 NOTE — Anesthesia Postprocedure Evaluation (Signed)
Anesthesia Post Note  Patient: Jack Clark  Procedure(s) Performed: REVERSE SHOULDER ARTHROPLASTY (Right Shoulder)     Patient location during evaluation: PACU Anesthesia Type: General Level of consciousness: awake and alert Pain management: pain level controlled Vital Signs Assessment: post-procedure vital signs reviewed and stable Respiratory status: spontaneous breathing, nonlabored ventilation and respiratory function stable Cardiovascular status: blood pressure returned to baseline and stable Postop Assessment: no apparent nausea or vomiting Anesthetic complications: no   No complications documented.  Last Vitals:  Vitals:   05/03/20 1045 05/03/20 1059  BP: 109/67 117/81  Pulse: 76 78  Resp: 15 16  Temp:  36.7 C  SpO2: 95% 97%    Last Pain:  Vitals:   05/03/20 1045  TempSrc:   PainSc: 0-No pain                 Lowella Curb

## 2020-05-03 NOTE — Anesthesia Procedure Notes (Signed)
Procedure Name: Intubation Date/Time: 05/03/2020 8:34 AM Performed by: Ezequiel Kayser, CRNA Pre-anesthesia Checklist: Patient identified, Emergency Drugs available, Suction available and Patient being monitored Patient Re-evaluated:Patient Re-evaluated prior to induction Oxygen Delivery Method: Circle System Utilized Preoxygenation: Pre-oxygenation with 100% oxygen Induction Type: IV induction Ventilation: Mask ventilation without difficulty Laryngoscope Size: Glidescope and 4 Grade View: Grade I Tube type: Oral Tube size: 7.0 mm Number of attempts: 2 Airway Equipment and Method: Stylet and Oral airway Placement Confirmation: ETT inserted through vocal cords under direct vision,  positive ETCO2 and breath sounds checked- equal and bilateral Tube secured with: Tape Dental Injury: Teeth and Oropharynx as per pre-operative assessment  Comments: 1st attempt by CRNA with mac 4, grade 3 view, MD grade 4 view as well. Pt mask ventilated with oral airway. Glidescope 4 used for grade 1 view. Atraumatic tracheal intubation

## 2020-05-03 NOTE — Op Note (Signed)
Orthopaedic Surgery Operative Note (CSN: [id number])  Jack Clark  09-Apr-1955 Date of Surgery: 05/03/2020   Diagnoses:  Right shoulder recurrent cuff tear, type II  Procedure: Right reverse total Shoulder Arthroplasty Removal of hardware   Operative Finding Successful completion of planned procedure.  We offered the patient the possibility of a superior capsular reconstruction based on the recovery and he is type II tear we did not feel that a recurrent repair would be successful.  He elected for reverse and we had good fixation on the humerus and the glenoid.  He had a in-out-in the past with his center screw to an extent and we had great purchase with this.  His inferior screw in his glenoid baseplate had relatively short length as the baseplate was interiorized.  We had great purchase and fixation with our anterior and superior screw.  Good stability as expected as the subscapularis repair was robust. Post-operative plan: The patient will be NWB in sling.  The patient will be will be discharged from PACU if continues to be stable as was plan prior to surgery.  DVT prophylaxis Aspirin 81 mg twice daily for 6 weeks.  Pain control with PRN pain medication preferring oral medicines.  Follow up plan will be scheduled in approximately 7 days for incision check and XR.  Physical therapy to start after first visit.  Implants: Tornier size 3B short stem, zero high offset tray with a 36+9 poly-, 36 glenosphere standard with a 25 standard baseplate and a 45 center screw.  3 peripheral screws.  Post-Op Diagnosis: Same Surgeons:Primary: Bjorn Pippin, MD Assistants:Caroline McBane PA-C Location: [facility] OR ROOM 6 Anesthesia: General with Exparel Interscalene Antibiotics: Ancef 2g preop, Vancomycin 1000mg  locally Tourniquet time: None Estimated Blood Loss: 100 Complications: None Specimens: None Implants: Implant Name Type Inv. Item Serial No. Manufacturer Lot No. LRB No. Used Action  BASEPLATE  GLENOSPHERE STD - Miscellaneous BASEPLATE GLENOSPHERE [id number] STD [employer]  Right 1 Implanted  SCREW CENTRAL THREAD 6.5X45 - [id number] Screw SCREW CENTRAL THREAD 6.5X45  [employer] STERILIZED ON SET Right 1 Implanted  SCREW 5.5X22 - [id number] Screw SCREW 5.5X22  [employer] STERILIZED ON SET Right 1 Implanted  SCREW PERIPHERAL 5.0X34 - [id number] Screw SCREW PERIPHERAL 5.0X34  [employer] STERILIZED ON SET Right 1 Implanted  Peripheral bone screw    [employer] STERILZIED ON SET Right 1 Implanted  GLENOSPHERE REV SHOULDER 36 - [id number] GLENOSPHERE REV SHOULDER 36 [id number] [employer]  Right 1 Implanted  Reversed Insert   [id number] [employer]  Right 1 Implanted  IMPLANT REVERSE SHOULDER 0X3.5 - [id number] Shoulder IMPLANT REVERSE SHOULDER 0X3.5 [id number] [employer]  Right 1 Implanted  HUMERAL STEM AEQUALIS 3BX74MM - [id number] Stem HUMERAL STEM [id number] [id number] Oliva Bustard [employer]  Right 1 Implanted    Indications for Surgery:   Jack Clark is a 65 y.o. male with recurrent type II rotator cuff tear and dysfunction.  Benefits and risks of operative and nonoperative management were discussed prior to surgery with patient/guardian(s) and informed consent form was completed.  Infection and need for further surgery were discussed as was prosthetic stability and cuff issues.  We additionally specifically discussed risks of axillary nerve injury, infection, periprosthetic fracture, continued pain and longevity of implants prior to beginning procedure.      Procedure:   The patient was identified in the preoperative holding area where the surgical site was marked. Block placed by anesthesia with exparel.  The patient was taken to the  OR where a procedural timeout was called and the above noted anesthesia was induced.  The patient was positioned beachchair on allen table with spider arm positioner.  Preoperative antibiotics were dosed.  The patient's right  shoulder was prepped and draped in the usual sterile fashion.  A second preoperative timeout was called.      Standard deltopectoral approach was performed with a #10 blade. We dissected down to the subcutaneous tissues and the cephalic vein was taken laterally with the deltoid. Clavipectoral fascia was incised in line with the incision. Deep retractors were placed. The long of the biceps tendon was identified and there was significant tenosynovitis present.  Tenodesis was performed to the pectoralis tendon with #2 Ethibond. The remaining biceps was followed up into the rotator interval where it was released.   The subscapularis was taken down in a full thickness layer with capsule along the humeral neck extending inferiorly around the humeral head. We continued releasing the capsule directly off of the osteophytes inferiorly all the way around the corner. This allowed [place] to dislocate the humeral head.   The humeral head was intact but the rotator cuff was irreparably torn with a type II tear.  Multiple sutures and 4 swivel lock anchors were removed.  Without removing these anchors we would have been kicked into varus with our stem.  There were osteophytes along the inferior humeral neck. The osteophytes were removed with an osteotome and a rongeur.  Osteophytes were removed with a rongeur and an osteotome and the anatomic neck was well visualized.     A humeral cutting guide was inserted down the intramedullary canal. The version was set at 20 of retroversion. Humeral osteotomy was performed with an oscillating saw. The head fragment was passed off the back table. A starter awl was used to open the humeral canal. We next used T-handle straight sound reamers to ream up to an appropriate fit. A chisel was used to remove proximal humeral bone. We then broached starting with a size one broach and broaching up to 3 which obtained an appropriate fit. The broach handle was removed. A cut protector was placed.  The broach handle was removed and a cut protector was placed. The humerus was retracted posteriorly and we turned our attention to glenoid exposure.  The subscapularis was again identified and immediately we took care to palpate the axillary nerve anteriorly and verify its position with gentle palpation as well as the tug test.  We then released the SGHL with bovie cautery prior to placing a curved mayo at the junction of the anterior glenoid well above the axillary nerve and bluntly dissecting the subscapularis from the capsule.  We then carefully protected the axillary nerve as we gently released the inferior capsule to fully mobilize the subscapularis.  An anterior deltoid retractor was then placed as well as a small Hohmann retractor superiorly.   Glenoid was relatively intact as would be expected in this cuff tear case.  The glenoid drill guide was placed and used to drill a guide pin in the center, inferior position. The glenoid face was then reamed concentrically over the guide wire. The center hole was drilled over the guidepin in a near anatomic angle of version. Next the  glenoid vault was drilled back to a depth of 45 mm.  We tapped and then placed a 31mm size baseplate with 51mm lateralization was selected with a 6.5 mm x 45 mm length central screw.  The base plate was screwed into the  glenoid vault obtaining secure fixation. We next placed superior and inferior locking screws for additional fixation.  Next a 36 mm glenosphere was selected and impacted onto the baseplate. The center screw was tightened.  We turned attention back to the humeral side. The cut protector was removed. We trialed with multiple size tray and polyethylene options and selected a 9 which provided good stability and range of motion without excess soft tissue tension. The offset was dialed in to match the normal anatomy. The shoulder was trialed.  There was good ROM in all planes and the shoulder was stable with no inferior  translation.  The real humeral implants were opened after again confirming sizes.  The trial was removed. #5 Fiberwire x4 sutures passed through the humeral neck for subscap repair. The humeral component was press-fit obtaining a secure fit. A +0 high offset tray was selected and impacted onto the stem.  A 36+6 polyethylene liner was impacted onto the stem.  The joint was reduced and thoroughly irrigated with pulsatile lavage. Subscap was repaired back with #5 Fiberwire sutures through bone tunnels. Hemostasis was obtained. The deltopectoral interval was reapproximated with #1 Ethibond. The subcutaneous tissues were closed with 2-0 Vicryl and the skin was closed with running monocryl.    The wounds were cleaned and dried and an Aquacel dressing was placed. The drapes taken down. The arm was placed into sling with abduction pillow. Patient was awakened, extubated, and transferred to the recovery room in stable condition. There were no intraoperative complications. The sponge, needle, and attention counts were  correct at the end of the case.     Alfonse Alpers, PA-C, present and scrubbed throughout the case, critical for completion in a timely fashion, and for retraction, instrumentation, closure.

## 2020-05-03 NOTE — Progress Notes (Signed)
Assisted Dr. Miller with right, ultrasound guided, interscalene  block. Side rails up, monitors on throughout procedure. See vital signs in flow sheet. Tolerated Procedure well. 

## 2020-05-03 NOTE — Interval H&P Note (Signed)
History and Physical Interval Note:  05/03/2020 8:19 AM  Jack Clark  has presented today for surgery, with the diagnosis of RIGHT SHOULDER DEGENERATIVE JOINT DISEASE; RETEAR OF ROTATOR CUFF TEAR.  The various methods of treatment have been discussed with the patient and family. After consideration of risks, benefits and other options for treatment, the patient has consented to  Procedure(s): REVERSE SHOULDER ARTHROPLASTY (Right) as a surgical intervention.  The patient's history has been reviewed, patient examined, no change in status, stable for surgery.  I have reviewed the patient's chart and labs.  Questions were answered to the patient's satisfaction.     Bjorn Pippin

## 2020-05-03 NOTE — Anesthesia Preprocedure Evaluation (Signed)
Anesthesia Evaluation  Patient identified by MRN, date of birth, ID band Patient awake    Reviewed: Allergy & Precautions, H&P , NPO status , Patient's Chart, lab work & pertinent test results  Airway Mallampati: I       Dental no notable dental hx. (+) Teeth Intact   Pulmonary neg pulmonary ROS, former smoker,    Pulmonary exam normal breath sounds clear to auscultation       Cardiovascular negative cardio ROS Normal cardiovascular exam Rhythm:Regular Rate:Normal     Neuro/Psych CVA, No Residual Symptoms negative neurological ROS  negative psych ROS   GI/Hepatic negative GI ROS, Neg liver ROS,   Endo/Other  negative endocrine ROSdiabetes, Well Controlled, Type 2, Oral Hypoglycemic Agents  Renal/GU negative Renal ROS  negative genitourinary   Musculoskeletal negative musculoskeletal ROS (+)   Abdominal Normal abdominal exam  (+)   Peds negative pediatric ROS (+)  Hematology negative hematology ROS (+)   Anesthesia Other Findings   Reproductive/Obstetrics negative OB ROS                             Anesthesia Physical  Anesthesia Plan  ASA: III  Anesthesia Plan: General   Post-op Pain Management:  Regional for Post-op pain   Induction: Intravenous  PONV Risk Score and Plan: 2 and Ondansetron, Midazolam and Treatment may vary due to age or medical condition  Airway Management Planned: Oral ETT  Additional Equipment: None  Intra-op Plan:   Post-operative Plan: Extubation in OR  Informed Consent: I have reviewed the patients History and Physical, chart, labs and discussed the procedure including the risks, benefits and alternatives for the proposed anesthesia with the patient or authorized representative who has indicated his/her understanding and acceptance.     Dental advisory given  Plan Discussed with: CRNA  Anesthesia Plan Comments:         Anesthesia Quick  Evaluation

## 2020-05-03 NOTE — Discharge Instructions (Signed)
Ramond Marrow MD, MPH Alfonse Alpers, PA-C [facility] [address] [phone] (tel)   [phone] (fax)   POST-OPERATIVE INSTRUCTIONS - TOTAL SHOULDER REPLACEMENT    WOUND CARE ? You may leave the operative dressing in place until your follow-up appointment. ? KEEP THE INCISIONS CLEAN AND DRY. ? There may be a small amount of fluid/bleeding leaking at the surgical site. This is normal after surgery.  ? If it fills with liquid or blood please call us immediately to change it for you. ? Use the provided ice machine or Ice packs as often as possible for the first 3-4 days, then as needed for pain relief.  Keep a layer of cloth or a shirt between your skin and the cooling unit to prevent frost bite as it can get very cold.  SHOWERING: - You may shower on Post-Op Day #2.  - The dressing is water resistant but do not scrub it as it may start to peel up.   - You may remove the sling for showering, but keep a water resistant pillow under the arm to keep both the  elbow and shoulder away from the body (mimicking the abduction sling).  - Gently pat the area dry.  - Do not soak the shoulder in water. Do not go swimming in the pool or ocean until your sutures are removed. - KEEP THE INCISIONS CLEAN AND DRY.  EXERCISES ? Wear the sling at all times except when doing your exercises.  ? You may remove the sling for showering, but keep the arm across the chest or in a secondary sling.    ? Accidental/Purposeful External Rotation and shoulder flexion (reaching behind you) is to be avoided at all costs for the first month. ? It is ok to come out of your sling if your are sitting and have assistance for eating.   ? Do not lift anything heavier than 1 pound until we discuss it further in clinic.  REGIONAL ANESTHESIA (NERVE BLOCKS) . The anesthesia team may have performed a nerve block for you if safe in the setting of your care.  This is a great tool used to minimize  pain.  Typically the block may start wearing off overnight but the long acting medicine may last for 3-4 days.  The nerve block wearing off can be a challenging period but please utilize your as needed pain medications to try and manage this period.    POST-OP MEDICATIONS- Multimodal approach to pain control . In general your pain will be controlled with a combination of substances.  Prescriptions unless otherwise discussed are electronically sent to your pharmacy.  This is a carefully made plan we use to minimize narcotic use.     ? Celebrex - Anti-inflammatory medication taken on a scheduled basis ? Acetaminophen - Non-narcotic pain medicine taken on a scheduled basis  ? Gabapentin - Non-narcotic pain medicine taken on a scheduled basis  ? Oxycodone - This is a strong narcotic, to be used only on an "as needed" basis for pain. ? Zofran -  take as needed for nausea   FOLLOW-UP ? If you develop a Fever (>101.5), Redness or Drainage from the surgical incision site, please call our office to arrange for an evaluation. ? Please call the office to schedule a follow-up appointment for a wound check, 7-10 days post-operatively.  IF YOU HAVE ANY QUESTIONS, PLEASE FEEL FREE TO CALL OUR OFFICE.  HELPFUL INFORMATION  . If you had a block, it  will wear off between 8-24 hrs postop typically.  This is period when your pain may go from nearly zero to the pain you would have had post-op without the block.  This is an abrupt transition but nothing dangerous is happening.  You may take an extra dose of narcotic when this happens.  ? Your arm will be in a sling following surgery. You will be in this sling for the next 3-4 weeks.  I will let you know the exact duration at your follow-up visit.  ? You may be more comfortable sleeping in a semi-seated position the first few nights following surgery.  Keep a pillow propped under the elbow and forearm for comfort.  If you have a recliner type of chair it might be  beneficial.  If not that is fine too, but it would be helpful to sleep propped up with pillows behind your operated shoulder as well under your elbow and forearm.  This will reduce pulling on the suture lines.  ? When dressing, put your operative arm in the sleeve first.  When getting undressed, take your operative arm out last.  Loose fitting, button-down shirts are recommended.  ? In most states it is against the law to drive while your arm is in a sling. And certainly against the law to drive while taking narcotics.  ? You may return to work/school in the next couple of days when you feel up to it. Desk work and typing in the sling is fine.  ? We suggest you use the pain medication the first night prior to going to bed, in order to ease any pain when the anesthesia wears off. You should avoid taking pain medications on an empty stomach as it will make you nauseous.  ? Do not drink alcoholic beverages or take illicit drugs when taking pain medications.  ? Pain medication may make you constipated.  Below are a few solutions to try in this order: - Decrease the amount of pain medication if you aren't having pain. - Drink lots of decaffeinated fluids. - Drink prune juice and/or each dried prunes  o If the first 3 don't work start with additional solutions - Take Colace - an over-the-counter stool softener - Take Senokot - an over-the-counter laxative - Take Miralax - a stronger over-the-counter laxative   Dental Antibiotics:  In most cases prophylactic antibiotics for Dental procdeures after total joint surgery are not necessary.  Exceptions are as follows:  1. History of prior total joint infection  2. Severely immunocompromised (Organ Transplant, cancer chemotherapy, Rheumatoid biologic meds such as Humera)  3. Poorly controlled diabetes (A1C &gt; 8.0, blood glucose over 200)  If you have one of these conditions, contact your surgeon for an antibiotic prescription, prior to  your dental procedure.   For more information including helpful videos and documents visit our website:   [email or web address]    Post Anesthesia Home Care Instructions  Activity: Get plenty of rest for the remainder of the day. A responsible individual must stay with you for 24 hours following the procedure.  For the next 24 hours, DO NOT: -Drive a car -Advertising copywriter -Drink alcoholic beverages -Take any medication unless instructed by your physician -Make any legal decisions or sign important papers.  Meals: Start with liquid foods such as gelatin or soup. Progress to regular foods as tolerated. Avoid greasy, spicy, heavy foods. If nausea and/or vomiting occur, drink only clear liquids until the nausea and/or vomiting subsides. Call your physician if vomiting continues.  Special Instructions/Symptoms: Your throat may feel dry or sore from the anesthesia or the breathing tube placed in your throat during surgery. If this causes discomfort, gargle with warm salt water. The discomfort should disappear within 24 hours.  If you had a scopolamine patch placed behind your ear for the management of post- operative nausea and/or vomiting:  1. The medication in the patch is effective for 72 hours, after which it should be removed.  Wrap patch in a tissue and discard in the trash. Wash hands thoroughly with soap and water. 2. You may remove the patch earlier than 72 hours if you experience unpleasant side effects which may include dry mouth, dizziness or visual disturbances. 3. Avoid touching the patch. Wash your hands with soap and water after contact with the patch.     Regional Anesthesia Blocks  1. Numbness or the inability to move the "blocked" extremity may last from 3-48 hours after placement. The length of time depends on the medication injected and your individual response to the medication. If the numbness is not going away after 48 hours, call  your surgeon.  2. The extremity that is blocked will need to be protected until the numbness is gone and the  Strength has returned. Because you cannot feel it, you will need to take extra care to avoid injury. Because it may be weak, you may have difficulty moving it or using it. You may not know what position it is in without looking at it while the block is in effect.  3. For blocks in the legs and feet, returning to weight bearing and walking needs to be done carefully. You will need to wait until the numbness is entirely gone and the strength has returned. You should be able to move your leg and foot normally before you try and bear weight or walk. You will need someone to be with you when you first try to ensure you do not fall and possibly risk injury.  4. Bruising and tenderness at the needle site are common side effects and will resolve in a few days.  5. Persistent numbness or new problems with movement should be communicated to the surgeon or the [facility] [phone] [facility] [phone]). Information for Discharge Teaching: EXPAREL (bupivacaine liposome injectable suspension)   Your surgeon or anesthesiologist gave you EXPAREL(bupivacaine) to help control your pain after surgery.   EXPAREL is a local anesthetic that provides pain relief by numbing the tissue around the surgical site.  EXPAREL is designed to release pain medication over time and can control pain for up to 72 hours.  Depending on how you respond to EXPAREL, you may require less pain medication during your recovery.  Possible side effects:  Temporary loss of sensation or ability to move in the area where bupivacaine was injected.  Nausea, vomiting, constipation  Rarely, numbness and tingling in your mouth or lips, lightheadedness, or anxiety may occur.  Call your doctor right away if you think you may be experiencing any of these sensations, or if you have other questions  regarding possible side effects.  Follow all other discharge instructions given to you by your surgeon or nurse. Eat a healthy diet and drink plenty of water or other fluids.  If you return to the hospital for any reason within 96 hours following the administration of EXPAREL, it is important for health care providers to know that you have received this anesthetic. A teal colored band has been placed on  your arm with the date, time and amount of EXPAREL you have received in order to alert and inform your health care providers. Please leave this armband in place for the full 96 hours following administration, and then you may remove the band.  No Tylenol or Celebrex until 1:00 PM.

## 2020-05-05 ENCOUNTER — Encounter (HOSPITAL_BASED_OUTPATIENT_CLINIC_OR_DEPARTMENT_OTHER): Payer: Self-pay | Admitting: Orthopaedic Surgery

## 2021-03-30 DIAGNOSIS — M19011 Primary osteoarthritis, right shoulder: Secondary | ICD-10-CM | POA: Diagnosis not present

## 2021-03-30 DIAGNOSIS — M25511 Pain in right shoulder: Secondary | ICD-10-CM | POA: Diagnosis not present

## 2021-04-16 DIAGNOSIS — M25511 Pain in right shoulder: Secondary | ICD-10-CM | POA: Diagnosis not present

## 2021-04-16 DIAGNOSIS — M19011 Primary osteoarthritis, right shoulder: Secondary | ICD-10-CM | POA: Diagnosis not present

## 2021-04-20 DIAGNOSIS — E1140 Type 2 diabetes mellitus with diabetic neuropathy, unspecified: Secondary | ICD-10-CM | POA: Diagnosis not present

## 2021-04-20 DIAGNOSIS — E785 Hyperlipidemia, unspecified: Secondary | ICD-10-CM | POA: Diagnosis not present

## 2021-04-26 DIAGNOSIS — G63 Polyneuropathy in diseases classified elsewhere: Secondary | ICD-10-CM | POA: Diagnosis not present

## 2021-04-26 DIAGNOSIS — Z0000 Encounter for general adult medical examination without abnormal findings: Secondary | ICD-10-CM | POA: Diagnosis not present

## 2021-04-26 DIAGNOSIS — I6930 Unspecified sequelae of cerebral infarction: Secondary | ICD-10-CM | POA: Diagnosis not present

## 2021-04-26 DIAGNOSIS — E785 Hyperlipidemia, unspecified: Secondary | ICD-10-CM | POA: Diagnosis not present

## 2021-04-26 DIAGNOSIS — G4733 Obstructive sleep apnea (adult) (pediatric): Secondary | ICD-10-CM | POA: Diagnosis not present

## 2021-04-26 DIAGNOSIS — J301 Allergic rhinitis due to pollen: Secondary | ICD-10-CM | POA: Diagnosis not present

## 2021-04-26 DIAGNOSIS — E1140 Type 2 diabetes mellitus with diabetic neuropathy, unspecified: Secondary | ICD-10-CM | POA: Diagnosis not present

## 2021-04-26 DIAGNOSIS — I25119 Atherosclerotic heart disease of native coronary artery with unspecified angina pectoris: Secondary | ICD-10-CM | POA: Diagnosis not present

## 2021-04-26 DIAGNOSIS — I672 Cerebral atherosclerosis: Secondary | ICD-10-CM | POA: Diagnosis not present

## 2021-04-26 DIAGNOSIS — K50119 Crohn's disease of large intestine with unspecified complications: Secondary | ICD-10-CM | POA: Diagnosis not present

## 2021-08-25 DIAGNOSIS — J019 Acute sinusitis, unspecified: Secondary | ICD-10-CM | POA: Diagnosis not present

## 2021-08-25 DIAGNOSIS — J209 Acute bronchitis, unspecified: Secondary | ICD-10-CM | POA: Diagnosis not present

## 2021-08-25 DIAGNOSIS — E119 Type 2 diabetes mellitus without complications: Secondary | ICD-10-CM | POA: Diagnosis not present

## 2021-09-13 DIAGNOSIS — S93401A Sprain of unspecified ligament of right ankle, initial encounter: Secondary | ICD-10-CM | POA: Diagnosis not present

## 2021-10-09 DIAGNOSIS — E785 Hyperlipidemia, unspecified: Secondary | ICD-10-CM | POA: Diagnosis not present

## 2021-10-09 DIAGNOSIS — E1140 Type 2 diabetes mellitus with diabetic neuropathy, unspecified: Secondary | ICD-10-CM | POA: Diagnosis not present

## 2021-10-17 DIAGNOSIS — G4733 Obstructive sleep apnea (adult) (pediatric): Secondary | ICD-10-CM | POA: Diagnosis not present

## 2021-10-17 DIAGNOSIS — I70213 Atherosclerosis of native arteries of extremities with intermittent claudication, bilateral legs: Secondary | ICD-10-CM | POA: Diagnosis not present

## 2021-10-17 DIAGNOSIS — I25119 Atherosclerotic heart disease of native coronary artery with unspecified angina pectoris: Secondary | ICD-10-CM | POA: Diagnosis not present

## 2021-10-17 DIAGNOSIS — G63 Polyneuropathy in diseases classified elsewhere: Secondary | ICD-10-CM | POA: Diagnosis not present

## 2021-10-17 DIAGNOSIS — I6930 Unspecified sequelae of cerebral infarction: Secondary | ICD-10-CM | POA: Diagnosis not present

## 2021-10-17 DIAGNOSIS — J301 Allergic rhinitis due to pollen: Secondary | ICD-10-CM | POA: Diagnosis not present

## 2021-10-17 DIAGNOSIS — E785 Hyperlipidemia, unspecified: Secondary | ICD-10-CM | POA: Diagnosis not present

## 2021-10-17 DIAGNOSIS — I672 Cerebral atherosclerosis: Secondary | ICD-10-CM | POA: Diagnosis not present

## 2021-10-17 DIAGNOSIS — E1140 Type 2 diabetes mellitus with diabetic neuropathy, unspecified: Secondary | ICD-10-CM | POA: Diagnosis not present

## 2021-10-17 DIAGNOSIS — K50119 Crohn's disease of large intestine with unspecified complications: Secondary | ICD-10-CM | POA: Diagnosis not present

## 2022-02-18 DIAGNOSIS — G63 Polyneuropathy in diseases classified elsewhere: Secondary | ICD-10-CM | POA: Diagnosis not present

## 2022-02-18 DIAGNOSIS — M25562 Pain in left knee: Secondary | ICD-10-CM | POA: Diagnosis not present

## 2022-02-18 DIAGNOSIS — G8929 Other chronic pain: Secondary | ICD-10-CM | POA: Diagnosis not present

## 2022-02-26 DIAGNOSIS — M19011 Primary osteoarthritis, right shoulder: Secondary | ICD-10-CM | POA: Diagnosis not present

## 2022-02-28 DIAGNOSIS — M25562 Pain in left knee: Secondary | ICD-10-CM | POA: Diagnosis not present

## 2022-03-05 DIAGNOSIS — R0981 Nasal congestion: Secondary | ICD-10-CM | POA: Diagnosis not present

## 2022-03-05 DIAGNOSIS — J019 Acute sinusitis, unspecified: Secondary | ICD-10-CM | POA: Diagnosis not present

## 2022-03-05 DIAGNOSIS — R059 Cough, unspecified: Secondary | ICD-10-CM | POA: Diagnosis not present

## 2022-03-22 IMAGING — CR DG SHOULDER 2+V PORT*R*
1 series · 1 of 1 positions shown · non-contrast
Comparison: None.

CLINICAL DATA: Status post right reverse shoulder arthroplasty.

EXAM:
PORTABLE RIGHT SHOULDER

[shoulder ap]
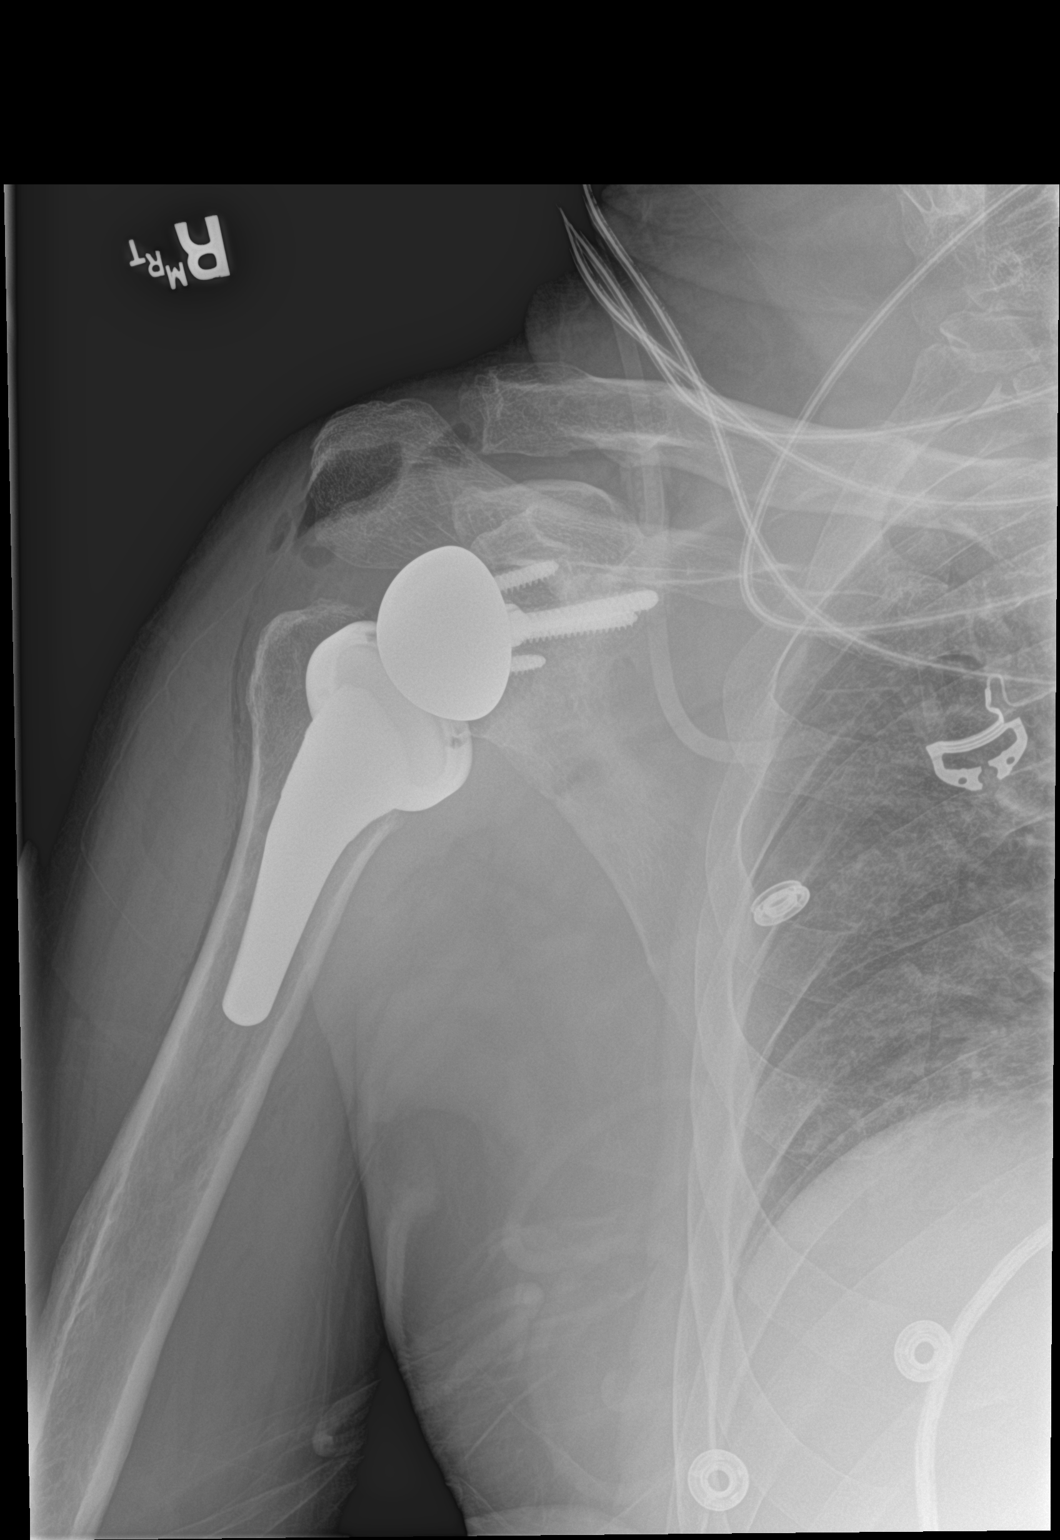

[1 of 1 positions shown; findings below may reference images not displayed]

FINDINGS: Reverse shoulder arthroplasty in expected alignment. There is no
periprosthetic lucency or fracture. Recent postsurgical change
includes air and edema in the joint space and soft tissues.
IMPRESSION: Reverse shoulder arthroplasty in expected alignment without
immediate postoperative complication.

## 2022-04-29 DIAGNOSIS — I25119 Atherosclerotic heart disease of native coronary artery with unspecified angina pectoris: Secondary | ICD-10-CM | POA: Diagnosis not present

## 2022-04-29 DIAGNOSIS — E1140 Type 2 diabetes mellitus with diabetic neuropathy, unspecified: Secondary | ICD-10-CM | POA: Diagnosis not present

## 2022-04-29 DIAGNOSIS — G63 Polyneuropathy in diseases classified elsewhere: Secondary | ICD-10-CM | POA: Diagnosis not present

## 2022-04-29 DIAGNOSIS — E785 Hyperlipidemia, unspecified: Secondary | ICD-10-CM | POA: Diagnosis not present

## 2022-04-29 DIAGNOSIS — L570 Actinic keratosis: Secondary | ICD-10-CM | POA: Diagnosis not present

## 2022-04-29 DIAGNOSIS — Z79899 Other long term (current) drug therapy: Secondary | ICD-10-CM | POA: Diagnosis not present

## 2022-04-29 DIAGNOSIS — I70213 Atherosclerosis of native arteries of extremities with intermittent claudication, bilateral legs: Secondary | ICD-10-CM | POA: Diagnosis not present

## 2022-04-29 DIAGNOSIS — I6930 Unspecified sequelae of cerebral infarction: Secondary | ICD-10-CM | POA: Diagnosis not present

## 2022-04-29 DIAGNOSIS — I672 Cerebral atherosclerosis: Secondary | ICD-10-CM | POA: Diagnosis not present

## 2022-04-29 DIAGNOSIS — K50119 Crohn's disease of large intestine with unspecified complications: Secondary | ICD-10-CM | POA: Diagnosis not present

## 2022-04-29 DIAGNOSIS — Z0000 Encounter for general adult medical examination without abnormal findings: Secondary | ICD-10-CM | POA: Diagnosis not present

## 2022-05-20 DIAGNOSIS — K50119 Crohn's disease of large intestine with unspecified complications: Secondary | ICD-10-CM | POA: Diagnosis not present

## 2022-05-20 DIAGNOSIS — L570 Actinic keratosis: Secondary | ICD-10-CM | POA: Diagnosis not present

## 2022-05-20 DIAGNOSIS — K5010 Crohn's disease of large intestine without complications: Secondary | ICD-10-CM | POA: Diagnosis not present

## 2022-06-11 DIAGNOSIS — D485 Neoplasm of uncertain behavior of skin: Secondary | ICD-10-CM | POA: Diagnosis not present

## 2022-08-06 DIAGNOSIS — Z8719 Personal history of other diseases of the digestive system: Secondary | ICD-10-CM | POA: Diagnosis not present

## 2022-11-12 DIAGNOSIS — E785 Hyperlipidemia, unspecified: Secondary | ICD-10-CM | POA: Diagnosis not present

## 2022-11-12 DIAGNOSIS — E1140 Type 2 diabetes mellitus with diabetic neuropathy, unspecified: Secondary | ICD-10-CM | POA: Diagnosis not present

## 2023-01-11 DIAGNOSIS — J019 Acute sinusitis, unspecified: Secondary | ICD-10-CM | POA: Diagnosis not present

## 2023-01-11 DIAGNOSIS — R051 Acute cough: Secondary | ICD-10-CM | POA: Diagnosis not present

## 2023-01-11 DIAGNOSIS — R0981 Nasal congestion: Secondary | ICD-10-CM | POA: Diagnosis not present

## 2023-01-11 DIAGNOSIS — J209 Acute bronchitis, unspecified: Secondary | ICD-10-CM | POA: Diagnosis not present

## 2023-01-12 DIAGNOSIS — Z9049 Acquired absence of other specified parts of digestive tract: Secondary | ICD-10-CM | POA: Diagnosis not present

## 2023-01-12 DIAGNOSIS — E7800 Pure hypercholesterolemia, unspecified: Secondary | ICD-10-CM | POA: Diagnosis not present

## 2023-01-12 DIAGNOSIS — R140 Abdominal distension (gaseous): Secondary | ICD-10-CM | POA: Diagnosis not present

## 2023-01-12 DIAGNOSIS — K5090 Crohn's disease, unspecified, without complications: Secondary | ICD-10-CM | POA: Diagnosis not present

## 2023-01-12 DIAGNOSIS — E119 Type 2 diabetes mellitus without complications: Secondary | ICD-10-CM | POA: Diagnosis not present

## 2023-01-12 DIAGNOSIS — K5651 Intestinal adhesions [bands], with partial obstruction: Secondary | ICD-10-CM | POA: Diagnosis not present

## 2023-01-12 DIAGNOSIS — K219 Gastro-esophageal reflux disease without esophagitis: Secondary | ICD-10-CM | POA: Diagnosis not present

## 2023-01-12 DIAGNOSIS — Z87891 Personal history of nicotine dependence: Secondary | ICD-10-CM | POA: Diagnosis not present

## 2023-01-12 DIAGNOSIS — R109 Unspecified abdominal pain: Secondary | ICD-10-CM | POA: Diagnosis not present

## 2023-01-12 DIAGNOSIS — E785 Hyperlipidemia, unspecified: Secondary | ICD-10-CM | POA: Diagnosis not present

## 2023-01-12 DIAGNOSIS — Z8719 Personal history of other diseases of the digestive system: Secondary | ICD-10-CM | POA: Diagnosis not present

## 2023-01-12 DIAGNOSIS — Z7984 Long term (current) use of oral hypoglycemic drugs: Secondary | ICD-10-CM | POA: Diagnosis not present

## 2023-01-12 DIAGNOSIS — K760 Fatty (change of) liver, not elsewhere classified: Secondary | ICD-10-CM | POA: Diagnosis not present

## 2023-01-12 DIAGNOSIS — K56600 Partial intestinal obstruction, unspecified as to cause: Secondary | ICD-10-CM | POA: Diagnosis not present

## 2023-01-12 DIAGNOSIS — K56609 Unspecified intestinal obstruction, unspecified as to partial versus complete obstruction: Secondary | ICD-10-CM | POA: Diagnosis not present

## 2023-01-12 DIAGNOSIS — Z8673 Personal history of transient ischemic attack (TIA), and cerebral infarction without residual deficits: Secondary | ICD-10-CM | POA: Diagnosis not present

## 2023-01-12 DIAGNOSIS — Z7902 Long term (current) use of antithrombotics/antiplatelets: Secondary | ICD-10-CM | POA: Diagnosis not present

## 2023-01-12 DIAGNOSIS — Z79899 Other long term (current) drug therapy: Secondary | ICD-10-CM | POA: Diagnosis not present

## 2023-01-12 DIAGNOSIS — Z794 Long term (current) use of insulin: Secondary | ICD-10-CM | POA: Diagnosis not present

## 2023-01-13 DIAGNOSIS — R140 Abdominal distension (gaseous): Secondary | ICD-10-CM | POA: Diagnosis not present

## 2023-01-13 DIAGNOSIS — K56609 Unspecified intestinal obstruction, unspecified as to partial versus complete obstruction: Secondary | ICD-10-CM | POA: Diagnosis not present

## 2023-01-13 DIAGNOSIS — K5651 Intestinal adhesions [bands], with partial obstruction: Secondary | ICD-10-CM | POA: Diagnosis not present

## 2023-03-09 ENCOUNTER — Encounter (HOSPITAL_BASED_OUTPATIENT_CLINIC_OR_DEPARTMENT_OTHER): Payer: Self-pay

## 2023-03-09 ENCOUNTER — Ambulatory Visit (HOSPITAL_BASED_OUTPATIENT_CLINIC_OR_DEPARTMENT_OTHER)
Admission: EM | Admit: 2023-03-09 | Discharge: 2023-03-09 | Disposition: A | Discharge status: Home / Self Care | Payer: TRICARE For Life (TFL)

## 2023-03-09 DIAGNOSIS — M25569 Pain in unspecified knee: Secondary | ICD-10-CM | POA: Insufficient documentation

## 2023-03-09 DIAGNOSIS — G5600 Carpal tunnel syndrome, unspecified upper limb: Secondary | ICD-10-CM | POA: Insufficient documentation

## 2023-03-09 DIAGNOSIS — M25519 Pain in unspecified shoulder: Secondary | ICD-10-CM | POA: Insufficient documentation

## 2023-03-09 DIAGNOSIS — U071 COVID-19: Secondary | ICD-10-CM

## 2023-03-09 DIAGNOSIS — J209 Acute bronchitis, unspecified: Secondary | ICD-10-CM

## 2023-03-09 DIAGNOSIS — E782 Mixed hyperlipidemia: Secondary | ICD-10-CM | POA: Insufficient documentation

## 2023-03-09 DIAGNOSIS — E1340 Other specified diabetes mellitus with diabetic neuropathy, unspecified: Secondary | ICD-10-CM | POA: Insufficient documentation

## 2023-03-09 DIAGNOSIS — Z87891 Personal history of nicotine dependence: Secondary | ICD-10-CM

## 2023-03-09 DIAGNOSIS — M5030 Other cervical disc degeneration, unspecified cervical region: Secondary | ICD-10-CM | POA: Insufficient documentation

## 2023-03-09 DIAGNOSIS — I10 Essential (primary) hypertension: Secondary | ICD-10-CM

## 2023-03-09 DIAGNOSIS — K5090 Crohn's disease, unspecified, without complications: Secondary | ICD-10-CM | POA: Insufficient documentation

## 2023-03-09 DIAGNOSIS — G4730 Sleep apnea, unspecified: Secondary | ICD-10-CM

## 2023-03-09 DIAGNOSIS — I639 Cerebral infarction, unspecified: Secondary | ICD-10-CM | POA: Insufficient documentation

## 2023-03-09 DIAGNOSIS — E119 Type 2 diabetes mellitus without complications: Secondary | ICD-10-CM | POA: Insufficient documentation

## 2023-03-09 DIAGNOSIS — J309 Allergic rhinitis, unspecified: Secondary | ICD-10-CM | POA: Insufficient documentation

## 2023-03-09 DIAGNOSIS — N529 Male erectile dysfunction, unspecified: Secondary | ICD-10-CM | POA: Insufficient documentation

## 2023-03-09 DIAGNOSIS — Z719 Counseling, unspecified: Secondary | ICD-10-CM | POA: Insufficient documentation

## 2023-03-09 DIAGNOSIS — M79673 Pain in unspecified foot: Secondary | ICD-10-CM | POA: Insufficient documentation

## 2023-03-09 DIAGNOSIS — M545 Low back pain, unspecified: Secondary | ICD-10-CM | POA: Insufficient documentation

## 2023-03-09 DIAGNOSIS — E291 Testicular hypofunction: Secondary | ICD-10-CM | POA: Insufficient documentation

## 2023-03-09 DIAGNOSIS — E785 Hyperlipidemia, unspecified: Secondary | ICD-10-CM | POA: Insufficient documentation

## 2023-03-09 HISTORY — DX: Essential (primary) hypertension: I10

## 2023-03-09 HISTORY — DX: Sleep apnea, unspecified: G47.30

## 2023-03-09 LAB — POC COVID19/FLU A&B COMBO
Covid Antigen, POC: POSITIVE — AB
Influenza A Antigen, POC: NEGATIVE
Influenza B Antigen, POC: NEGATIVE

## 2023-03-09 MED ORDER — MOLNUPIRAVIR EUA 200MG CAPSULE: 4.0000 | ORAL_CAPSULE | Freq: Two times a day (BID) | ORAL | 0 refills | Status: AC

## 2023-03-09 MED ORDER — DEXAMETHASONE SODIUM PHOSPHATE 10 MG/ML IJ SOLN
10.0000 mg | Freq: Once | INTRAMUSCULAR | Status: AC
  Administered 2023-03-09: 10 mg via INTRAMUSCULAR

## 2023-03-09 MED ORDER — BENZONATATE 100 MG PO CAPS: 100.0000 mg | ORAL_CAPSULE | Freq: Three times a day (TID) | ORAL | 0 refills | Status: AC

## 2023-03-09 NOTE — ED Provider Notes (Addendum)
[facility]    CSN: [id number] Arrival date & time: 03/09/23  4098      History   Chief Complaint Chief Complaint  Patient presents with   Generalized Body Aches   Chills   Headache   Cough    HPI Jack Clark is a 67 y.o. male.   Patient presents to urgent care for evaluation of cough, generalized bodyaches, sore throat, chills, headache, and generalized fatigue that started abruptly on Friday, December 20th 2024 (2 days ago).  Cough is nonproductive and dry.  Reports intermittent shortness of breath and chest tightness associated with cough.  He is a former smoker, denies history of chronic respiratory problems.  Headache is generalized and nasal congestion is clear/yellow.  He has access to an albuterol inhaler from a previous bronchitis illness and has been using this as needed for shortness of breath with some relief.  He has heard some noisy breathing/wheezing to the lungs overnight and states albuterol improves this.  History of COVID-19 in 2020 requiring hospitalization for 28 days.  He has not had COVID-19 illness in the last 90 days documented.  No recent sick contacts with similar symptoms.  Unsure of max temp at home.  Taking over-the-counter Tylenol without much relief.   Headache Associated symptoms: cough   Cough Associated symptoms: headaches     Past Medical History:  Diagnosis Date   COVID 2021   hosptialized 28 days    Crohn's disease (HCC)    Diabetes mellitus without complication (HCC)    History of COVID-19 11-2019 to 01-2020   [facility] days at [facility]--on [facility]   Hypertension 03/09/2023   Sleep apnea 03/09/2023   Stroke (HCC)    2018 no deficits     Patient Active Problem List   Diagnosis Date Noted   Allergic rhinitis 03/09/2023   DDD (degenerative disc disease), cervical 03/09/2023   Carpal tunnel syndrome 03/09/2023   Counseling, unspecified 03/09/2023   ED (erectile dysfunction) 03/09/2023   Hypertension  03/09/2023   Hyperlipidemia 03/09/2023   Low back pain 03/09/2023   Mixed hyperlipidemia 03/09/2023   Pain of knee joint on movement 03/09/2023   Pain in unspecified shoulder 03/09/2023   Sleep apnea 03/09/2023   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   Cerebrovascular accident (HCC) 03/09/2023   Type 2 diabetes mellitus without complications (HCC) 03/09/2023   Crohn's disease (HCC) 03/09/2023   Other specified diabetes mellitus with diabetic neuropathy, unspecified (HCC) 03/09/2023   Pain in unspecified foot 03/09/2023   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   Chronic right shoulder pain 04/16/2018   Nontraumatic complete tear of right rotator cuff 04/16/2018   Diabetic polyneuropathy associated with type 2 diabetes mellitus (HCC) 04/08/2017   Hallux limitus, acquired, right 04/08/2017   History of ulcerative colitis 04/01/2017   Abdominal pain 02/01/2016   Rupture of right biceps tendon 01/31/2016   Stroke (cerebrum) (HCC) 01/31/2016   Type 2 diabetes mellitus (HCC) 03/19/2015   H/O urethral stricture 03/23/2013   Intra-abdominal fluid collection 01/03/2013   H/O ileostomy 11/26/2012   Ulcerative colitis (HCC) 10/24/2011    Past Surgical History:  Procedure Laterality Date   ANTERIOR CRUCIATE LIGAMENT REPAIR     BICEPT TENODESIS Right 09/23/2019   Procedure: BICEPS TENODESIS;  Surgeon: Bjorn Pippin, MD;  Location: [facility];  Service: Orthopedics;  Laterality: Right;   ILEOSTOMY     ileostomy reversal     REVERSE SHOULDER ARTHROPLASTY Right 05/03/2020  Procedure: REVERSE SHOULDER ARTHROPLASTY;  Surgeon: Bjorn Pippin, MD;  Location: [facility];  Service: Orthopedics;  Laterality: Right;   SHOULDER ARTHROSCOPY WITH ROTATOR CUFF REPAIR Right 09/23/2019   Procedure: SHOULDER ARTHROSCOPY WITH ROTATOR CUFF REPAIR;  Surgeon: Bjorn Pippin, MD;  Location: [facility];  Service: Orthopedics;  Laterality: Right;        Home Medications    Prior to Admission medications   Medication Sig Start Date End Date Taking? Authorizing Provider  albuterol (VENTOLIN HFA) 108 (90 Base) MCG/ACT inhaler Inhale 2 puffs into the lungs every 4 (four) hours as needed for wheezing or shortness of breath. 05/15/17  Yes [provider]  amoxicillin (AMOXIL) 500 MG capsule Take 500 mg by mouth 2 (two) times daily. 04/22/22  Yes [provider]  amoxicillin-clavulanate (AUGMENTIN) 875-125 MG tablet Take 1 tablet by mouth 2 (two) times daily. 03/05/22  Yes [provider]  aspirin 81 MG chewable tablet Chew 81 mg by mouth daily. 01/29/17  Yes [provider]  benzonatate (TESSALON) 100 MG capsule Take 1 capsule (100 mg total) by mouth every 8 (eight) hours. 03/09/23  Yes Carlisle Beers, FNP  diclofenac Sodium (VOLTAREN) 1 % GEL Apply 2 g topically 2 (two) times daily. 07/02/22  Yes [provider]  insulin glargine-yfgn (SEMGLEE) 100 UNIT/ML Pen Inject 30 Units into the skin daily. 12/26/22  Yes [provider]  loratadine (CLARITIN) 10 MG tablet Take 10 mg by mouth daily. 12/26/22  Yes [provider]  molnupiravir EUA (LAGEVRIO) 200 mg CAPS capsule Take 4 capsules (800 mg total) by mouth 2 (two) times daily for 5 days. 03/09/23 03/14/23 Yes Carlisle Beers, FNP  Multiple Vitamin (MULTI-VITAMIN) tablet Take 1 tablet by mouth daily. 02/24/15  Yes [provider]  Omega-3 Krill Oil 1000 MG CAPS Take 1 capsule by mouth daily. 02/24/15  Yes [provider]  promethazine-dextromethorphan (PROMETHAZINE-DM) 6.25-15 MG/5ML syrup Take 5 mLs by mouth 4 (four) times daily as needed for cough. 03/05/22  Yes [provider]  sildenafil (VIAGRA) 100 MG tablet Take 100 mg by mouth as needed for erectile dysfunction. 04/23/19  Yes [provider]  Ascorbic Acid (VITAMIN C) 100 MG tablet Take by mouth daily.    [provider]   aspirin 81 MG chewable tablet Chew by mouth daily.    [provider]  atorvastatin (LIPITOR) 20 MG tablet Take 20 mg by mouth daily.    [provider]  cholecalciferol (VITAMIN D3) 25 MCG (1000 UNIT) tablet Take 1,000 Units by mouth daily.    [provider]  clopidogrel (PLAVIX) 75 MG tablet Take 75 mg by mouth daily.    [provider]  empagliflozin (JARDIANCE) 25 MG TABS tablet Take 25 mg by mouth daily.    [provider]  gabapentin (NEURONTIN) 100 MG capsule Take 1 capsule (100 mg total) by mouth 3 (three) times daily. For pain. 05/03/20 06/02/20  Vernetta Honey, PA-C  Insulin Glargine (BASAGLAR KWIKPEN) 100 UNIT/ML Inject 30 Units into the skin daily.    [provider]  metFORMIN (GLUCOPHAGE) 500 MG tablet Take by mouth 2 (two) times daily with a meal.    [provider]  Multiple Vitamin (MULTI-VITAMIN) tablet Take 1 tablet by mouth daily.    [provider]  Zinc Acetate, Oral, (ZINC ACETATE PO) Take by mouth.    [provider]    Family History History reviewed. No pertinent family history.  Social History Social History   Tobacco Use   Smoking status: Former   Smokeless tobacco: Never   Tobacco comments:    quit 15 years ago  Substance Use Topics   Alcohol use: Not Currently   Drug use: Not Currently     Allergies   Patient has no known allergies.   Review of Systems Review of Systems  Respiratory:  Positive for cough.   Neurological:  Positive for headaches.  Per HPI   Physical Exam Triage Vital Signs ED Triage Vitals  Encounter Vitals Group     BP 03/09/23 1122 100/69     Systolic BP Percentile --      Diastolic BP Percentile --      Pulse Rate 03/09/23 1122 85     Resp 03/09/23 1122 20     Temp 03/09/23 1122 99.1 F (37.3 C)     Temp Source 03/09/23 1122 Oral     SpO2 --      Weight 03/09/23 1123 180 lb (81.6 kg)     Height --      Head Circumference --       Peak Flow --      Pain Score 03/09/23 1122 7     Pain Loc --      Pain Education --      Exclude from Growth Chart --    No data found.  Updated Vital Signs BP 100/69 (BP Location: Right Arm)   Pulse 85   Temp 99.1 F (37.3 C) (Oral)   Resp 20   Wt 180 lb (81.6 kg)   BMI 25.83 kg/m   Visual Acuity Right Eye Distance:   Left Eye Distance:   Bilateral Distance:    Right Eye Near:   Left Eye Near:    Bilateral Near:     Physical Exam Vitals and nursing note reviewed.  Constitutional:      Appearance: He is ill-appearing. He is not toxic-appearing.  HENT:     Head: Normocephalic and atraumatic.     Right Ear: Hearing, tympanic membrane, ear canal and external ear normal.     Left Ear: Hearing, tympanic membrane, ear canal and external ear normal.     Nose: Congestion present.     Mouth/Throat:     Lips: Pink.     Mouth: Mucous membranes are moist. No injury.     Tongue: No lesions. Tongue does not deviate from midline.     Palate: No mass and lesions.     Pharynx: Oropharynx is clear. Uvula midline. Posterior oropharyngeal erythema present. No pharyngeal swelling, oropharyngeal exudate or uvula swelling.     Tonsils: No tonsillar exudate or tonsillar abscesses.  Eyes:     General: Lids are normal. Vision grossly intact. Gaze aligned appropriately.     Extraocular Movements: Extraocular movements intact.     Conjunctiva/sclera: Conjunctivae normal.  Cardiovascular:     Rate and Rhythm: Normal rate and regular rhythm.     Heart sounds: Normal heart sounds, S1 normal and S2 normal.  Pulmonary:     Effort: Pulmonary effort is normal. No respiratory distress.     Breath sounds: Normal air entry. No stridor. No wheezing, rhonchi or rales.     Comments: Coarse breath sounds throughout all lung fields bilaterally.  Speaking in full sentences without difficulty. Chest:     Chest wall: No tenderness.  Musculoskeletal:     Cervical back: Neck supple.     Right lower leg: No  edema.  Left lower leg: No edema.  Lymphadenopathy:     Cervical: No cervical adenopathy.  Skin:    General: Skin is warm and dry.     Capillary Refill: Capillary refill takes less than 2 seconds.     Findings: No rash.  Neurological:     General: No focal deficit present.     Mental Status: He is alert and oriented to person, place, and time. Mental status is at baseline.     Cranial Nerves: No cranial nerve deficit, dysarthria or facial asymmetry.     Motor: No weakness.     Coordination: Coordination normal.     Gait: Gait normal.     Deep Tendon Reflexes: Reflexes normal.  Psychiatric:        Mood and Affect: Mood normal.        Speech: Speech normal.        Behavior: Behavior normal.        Thought Content: Thought content normal.        Judgment: Judgment normal.      UC Treatments / Results  Labs (all labs ordered are listed, but only abnormal results are displayed) Labs Reviewed  POC COVID19/FLU A&B COMBO - Abnormal; Notable for the following components:      Result Value   Covid Antigen, POC Positive (*)    All other components within normal limits    EKG   Radiology No results found.  Procedures Procedures (including critical care time)  Medications Ordered in UC Medications  dexamethasone (DECADRON) injection 10 mg (has no administration in time range)    Initial Impression / Assessment and Plan / UC Course  I have reviewed the triage vital signs and the nursing notes.  Pertinent labs & imaging results that were available during my care of the patient were reviewed by me and considered in my medical decision making (see chart for details).   1.  COVID-19, acute bronchitis Point-of-care COVID-19 testing is positive.  Reviewed CDC guidelines regarding masking and quarantine. Dexamethasone 10 mg IM given for viral bronchitis.  He may continue use of albuterol inhaler as needed at home.  Discussed and dexamethasone may temporarily increase blood  sugars.  He is at risk for severe COVID-19 disease and takes Plavix blood thinner so he is not a candidate for Paxlovid. Molnupiravir sent to pharmacy to be taken as prescribed. Recommend Mucinex and Tessalon Perles as needed for supportive care as well as Tylenol for body aches and fever.  No new oxygen requirement (95% on room air), low suspicion for focal consolidation/pneumonia, therefore deferred imaging of the chest.  Counseled patient on potential for adverse effects with medications prescribed/recommended today, strict ER and return-to-clinic precautions discussed, patient verbalized understanding.   Final Clinical Impressions(s) / UC Diagnoses   Final diagnoses:  COVID-19  Acute bronchitis, unspecified organism     Discharge Instructions      You have COVID-19 viral illness. Wear a mask for 5 days of symptoms. You may return to public within those 5 days as long as you do not have a fever. Wash hands frequently.  Take antiviral every 12 hours for the next 5 days.   - Take prescribed medicines to help with symptoms: tessalon perles - Use over the counter medicines to help with symptoms as discussed: Tylenol, guaifenesin (mucinex), zyrtec, etc - Two teaspoons of honey in warm water every 4-6 hours may help with throat pains - Humidifier in your room at night to help add water the air  and soothe cough  If you develop any new or worsening symptoms or do not improve in the next 2 to 3 days, please return.  If your symptoms are severe, please go to the emergency room.  Follow-up with PCP as needed.     ED Prescriptions     Medication Sig Dispense Auth. Provider   molnupiravir EUA (LAGEVRIO) 200 mg CAPS capsule Take 4 capsules (800 mg total) by mouth 2 (two) times daily for 5 days. 40 capsule Reita May M, FNP   benzonatate (TESSALON) 100 MG capsule Take 1 capsule (100 mg total) by mouth every 8 (eight) hours. 21 capsule Carlisle Beers, FNP      PDMP not  reviewed this encounter.   Carlisle Beers, FNP 03/09/23 1148    Reita May [place], [place] 03/09/23 1200

## 2023-03-09 NOTE — Discharge Instructions (Addendum)
You have COVID-19 viral illness. Wear a mask for 5 days of symptoms. You may return to public within those 5 days as long as you do not have a fever. Wash hands frequently.  Take antiviral every 12 hours for the next 5 days.   - Take prescribed medicines to help with symptoms: tessalon perles - Use over the counter medicines to help with symptoms as discussed: Tylenol, guaifenesin (mucinex), zyrtec, etc - Two teaspoons of honey in warm water every 4-6 hours may help with throat pains - Humidifier in your room at night to help add water the air and soothe cough  If you develop any new or worsening symptoms or do not improve in the next 2 to 3 days, please return.  If your symptoms are severe, please go to the emergency room.  Follow-up with PCP as needed.

## 2023-03-09 NOTE — ED Triage Notes (Signed)
Symptoms onset Friday and worsening over the weekend. Has been using mucinex, tylenol, sudafed.

## 2023-03-27 DIAGNOSIS — I6930 Unspecified sequelae of cerebral infarction: Secondary | ICD-10-CM | POA: Diagnosis not present

## 2023-03-27 DIAGNOSIS — Z79899 Other long term (current) drug therapy: Secondary | ICD-10-CM | POA: Diagnosis not present

## 2023-03-27 DIAGNOSIS — E785 Hyperlipidemia, unspecified: Secondary | ICD-10-CM | POA: Diagnosis not present

## 2023-03-27 DIAGNOSIS — K5010 Crohn's disease of large intestine without complications: Secondary | ICD-10-CM | POA: Diagnosis not present

## 2023-03-27 DIAGNOSIS — K50119 Crohn's disease of large intestine with unspecified complications: Secondary | ICD-10-CM | POA: Diagnosis not present

## 2023-03-27 DIAGNOSIS — G63 Polyneuropathy in diseases classified elsewhere: Secondary | ICD-10-CM | POA: Diagnosis not present

## 2023-03-27 DIAGNOSIS — I672 Cerebral atherosclerosis: Secondary | ICD-10-CM | POA: Diagnosis not present

## 2023-03-27 DIAGNOSIS — I70213 Atherosclerosis of native arteries of extremities with intermittent claudication, bilateral legs: Secondary | ICD-10-CM | POA: Diagnosis not present

## 2023-03-27 DIAGNOSIS — G4733 Obstructive sleep apnea (adult) (pediatric): Secondary | ICD-10-CM | POA: Diagnosis not present

## 2023-03-27 DIAGNOSIS — I25119 Atherosclerotic heart disease of native coronary artery with unspecified angina pectoris: Secondary | ICD-10-CM | POA: Diagnosis not present

## 2023-03-27 DIAGNOSIS — E1140 Type 2 diabetes mellitus with diabetic neuropathy, unspecified: Secondary | ICD-10-CM | POA: Diagnosis not present

## 2023-09-09 DIAGNOSIS — H2513 Age-related nuclear cataract, bilateral: Secondary | ICD-10-CM | POA: Diagnosis not present

## 2023-09-09 DIAGNOSIS — H2511 Age-related nuclear cataract, right eye: Secondary | ICD-10-CM | POA: Diagnosis not present

## 2023-09-09 DIAGNOSIS — H18413 Arcus senilis, bilateral: Secondary | ICD-10-CM | POA: Diagnosis not present

## 2023-09-09 DIAGNOSIS — H25043 Posterior subcapsular polar age-related cataract, bilateral: Secondary | ICD-10-CM | POA: Diagnosis not present

## 2023-09-09 DIAGNOSIS — H25013 Cortical age-related cataract, bilateral: Secondary | ICD-10-CM | POA: Diagnosis not present

## 2023-09-16 DIAGNOSIS — K5010 Crohn's disease of large intestine without complications: Secondary | ICD-10-CM | POA: Diagnosis not present

## 2023-09-16 DIAGNOSIS — Z0000 Encounter for general adult medical examination without abnormal findings: Secondary | ICD-10-CM | POA: Diagnosis not present

## 2023-09-16 DIAGNOSIS — K50119 Crohn's disease of large intestine with unspecified complications: Secondary | ICD-10-CM | POA: Diagnosis not present

## 2023-09-16 DIAGNOSIS — I25119 Atherosclerotic heart disease of native coronary artery with unspecified angina pectoris: Secondary | ICD-10-CM | POA: Diagnosis not present

## 2023-09-16 DIAGNOSIS — I6930 Unspecified sequelae of cerebral infarction: Secondary | ICD-10-CM | POA: Diagnosis not present

## 2023-09-16 DIAGNOSIS — E785 Hyperlipidemia, unspecified: Secondary | ICD-10-CM | POA: Diagnosis not present

## 2023-09-16 DIAGNOSIS — G63 Polyneuropathy in diseases classified elsewhere: Secondary | ICD-10-CM | POA: Diagnosis not present

## 2023-09-16 DIAGNOSIS — E1140 Type 2 diabetes mellitus with diabetic neuropathy, unspecified: Secondary | ICD-10-CM | POA: Diagnosis not present

## 2023-09-16 DIAGNOSIS — I70213 Atherosclerosis of native arteries of extremities with intermittent claudication, bilateral legs: Secondary | ICD-10-CM | POA: Diagnosis not present

## 2023-09-16 DIAGNOSIS — I672 Cerebral atherosclerosis: Secondary | ICD-10-CM | POA: Diagnosis not present

## 2023-09-16 DIAGNOSIS — G4733 Obstructive sleep apnea (adult) (pediatric): Secondary | ICD-10-CM | POA: Diagnosis not present

## 2023-09-29 DIAGNOSIS — H52209 Unspecified astigmatism, unspecified eye: Secondary | ICD-10-CM | POA: Diagnosis not present

## 2023-09-29 DIAGNOSIS — H2511 Age-related nuclear cataract, right eye: Secondary | ICD-10-CM | POA: Diagnosis not present

## 2023-09-30 DIAGNOSIS — H2512 Age-related nuclear cataract, left eye: Secondary | ICD-10-CM | POA: Diagnosis not present

## 2023-09-30 DIAGNOSIS — H25012 Cortical age-related cataract, left eye: Secondary | ICD-10-CM | POA: Diagnosis not present

## 2023-09-30 DIAGNOSIS — H25042 Posterior subcapsular polar age-related cataract, left eye: Secondary | ICD-10-CM | POA: Diagnosis not present

## 2023-10-13 DIAGNOSIS — H25012 Cortical age-related cataract, left eye: Secondary | ICD-10-CM | POA: Diagnosis not present

## 2023-10-13 DIAGNOSIS — H25042 Posterior subcapsular polar age-related cataract, left eye: Secondary | ICD-10-CM | POA: Diagnosis not present

## 2023-10-13 DIAGNOSIS — H52209 Unspecified astigmatism, unspecified eye: Secondary | ICD-10-CM | POA: Diagnosis not present

## 2023-10-13 DIAGNOSIS — H2512 Age-related nuclear cataract, left eye: Secondary | ICD-10-CM | POA: Diagnosis not present

## 2023-10-15 DIAGNOSIS — H2512 Age-related nuclear cataract, left eye: Secondary | ICD-10-CM | POA: Diagnosis not present

## 2023-11-18 DIAGNOSIS — R0981 Nasal congestion: Secondary | ICD-10-CM | POA: Diagnosis not present

## 2023-11-18 DIAGNOSIS — R070 Pain in throat: Secondary | ICD-10-CM | POA: Diagnosis not present

## 2023-11-18 DIAGNOSIS — R051 Acute cough: Secondary | ICD-10-CM | POA: Diagnosis not present

## 2023-11-18 DIAGNOSIS — R509 Fever, unspecified: Secondary | ICD-10-CM | POA: Diagnosis not present

## 2023-12-08 DIAGNOSIS — H1089 Other conjunctivitis: Secondary | ICD-10-CM | POA: Diagnosis not present

## 2023-12-10 DIAGNOSIS — R109 Unspecified abdominal pain: Secondary | ICD-10-CM | POA: Diagnosis not present

## 2023-12-10 DIAGNOSIS — Z8719 Personal history of other diseases of the digestive system: Secondary | ICD-10-CM | POA: Diagnosis not present

## 2023-12-10 DIAGNOSIS — R1031 Right lower quadrant pain: Secondary | ICD-10-CM | POA: Diagnosis not present

## 2023-12-10 DIAGNOSIS — R1033 Periumbilical pain: Secondary | ICD-10-CM | POA: Diagnosis not present

## 2023-12-10 DIAGNOSIS — Z9049 Acquired absence of other specified parts of digestive tract: Secondary | ICD-10-CM | POA: Diagnosis not present
